# Patient Record
Sex: Female | Born: 1935 | Race: White | Hispanic: No | Smoking: Former smoker
Health system: Southern US, Community
[De-identification: ages and names within clinical notes are randomized; demographics above are authoritative.]

## PROBLEM LIST (undated history)

## (undated) DIAGNOSIS — E785 Hyperlipidemia, unspecified: Secondary | ICD-10-CM

## (undated) DIAGNOSIS — I10 Essential (primary) hypertension: Secondary | ICD-10-CM

## (undated) HISTORY — DX: Hyperlipidemia, unspecified: E78.5

## (undated) HISTORY — PX: JOINT REPLACEMENT: SHX530

## (undated) HISTORY — DX: Essential (primary) hypertension: I10

---

## 1991-10-30 HISTORY — PX: COSMETIC SURGERY: SHX468

## 2000-10-29 HISTORY — PX: KNEE SURGERY: SHX244

## 2002-08-29 HISTORY — PX: BREAST SURGERY: SHX581

## 2004-09-19 ENCOUNTER — Ambulatory Visit: Payer: Self-pay | Admitting: Family Medicine

## 2004-10-09 ENCOUNTER — Ambulatory Visit: Payer: Self-pay | Admitting: Unknown Physician Specialty

## 2005-06-08 ENCOUNTER — Ambulatory Visit: Payer: Self-pay | Admitting: General Surgery

## 2006-07-04 ENCOUNTER — Ambulatory Visit: Payer: Self-pay | Admitting: Family Medicine

## 2007-04-30 ENCOUNTER — Ambulatory Visit: Payer: Self-pay | Admitting: Family Medicine

## 2007-09-04 ENCOUNTER — Ambulatory Visit: Payer: Self-pay | Admitting: Family Medicine

## 2007-09-08 ENCOUNTER — Ambulatory Visit: Payer: Self-pay | Admitting: Family Medicine

## 2008-09-07 ENCOUNTER — Ambulatory Visit: Payer: Self-pay | Admitting: Family Medicine

## 2009-10-05 ENCOUNTER — Ambulatory Visit: Payer: Self-pay | Admitting: Family Medicine

## 2009-11-16 ENCOUNTER — Ambulatory Visit: Payer: Self-pay | Admitting: Ophthalmology

## 2012-04-23 ENCOUNTER — Ambulatory Visit: Payer: Self-pay | Admitting: Ophthalmology

## 2012-10-07 ENCOUNTER — Ambulatory Visit: Payer: Self-pay | Admitting: Family Medicine

## 2012-10-09 ENCOUNTER — Ambulatory Visit: Payer: Self-pay | Admitting: Family Medicine

## 2013-07-13 ENCOUNTER — Ambulatory Visit: Payer: Self-pay | Admitting: Unknown Physician Specialty

## 2014-03-30 LAB — HEPATIC FUNCTION PANEL
ALT: 15 U/L (ref 7–35)
AST: 17 U/L (ref 13–35)

## 2014-03-30 LAB — LIPID PANEL
Cholesterol: 203 mg/dL — AB (ref 0–200)
HDL: 61 mg/dL (ref 35–70)
LDL Cholesterol: 111 mg/dL
Triglycerides: 155 mg/dL (ref 40–160)

## 2014-03-30 LAB — TSH: TSH: 2.44 u[IU]/mL (ref 0.41–5.90)

## 2014-03-30 LAB — CBC AND DIFFERENTIAL
HCT: 42 % (ref 36–46)
Hemoglobin: 14 g/dL (ref 12.0–16.0)
Platelets: 236 10*3/uL (ref 150–399)
WBC: 6.5 10*3/mL

## 2014-03-30 LAB — HEMOGLOBIN A1C: Hgb A1c MFr Bld: 6.5 % — AB (ref 4.0–6.0)

## 2014-03-30 LAB — BASIC METABOLIC PANEL
BUN: 13 mg/dL (ref 4–21)
CREATININE: 0.8 mg/dL (ref 0.5–1.1)
GLUCOSE: 108 mg/dL
POTASSIUM: 4.5 mmol/L (ref 3.4–5.3)
SODIUM: 139 mmol/L (ref 137–147)

## 2014-06-01 DIAGNOSIS — R0681 Apnea, not elsewhere classified: Secondary | ICD-10-CM | POA: Insufficient documentation

## 2014-06-23 IMAGING — US ULTRASOUND RIGHT BREAST
1 series · 14 of 23 positions shown · non-contrast
Comparison: none

REASON FOR EXAM: av rt nodular density
COMMENTS:

[Series 1: ultrasound right breast · 0.11mm/px · 14 of 23 slices shown]
[im 1/23]
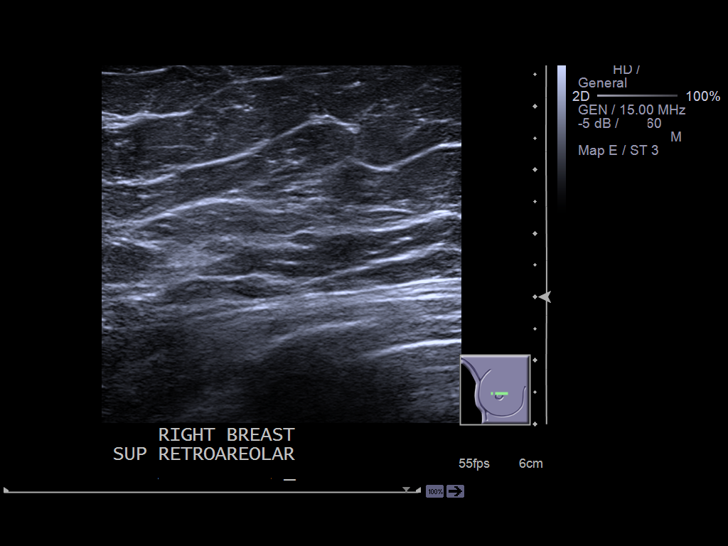
[im 3/23]
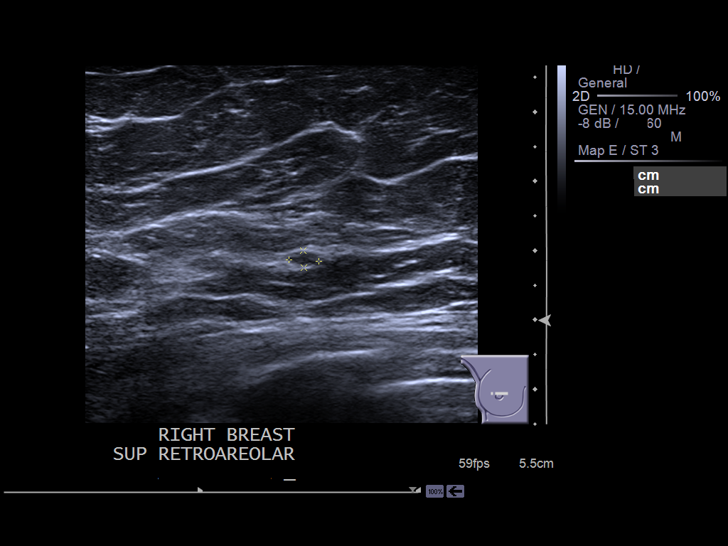
[im 5/23]
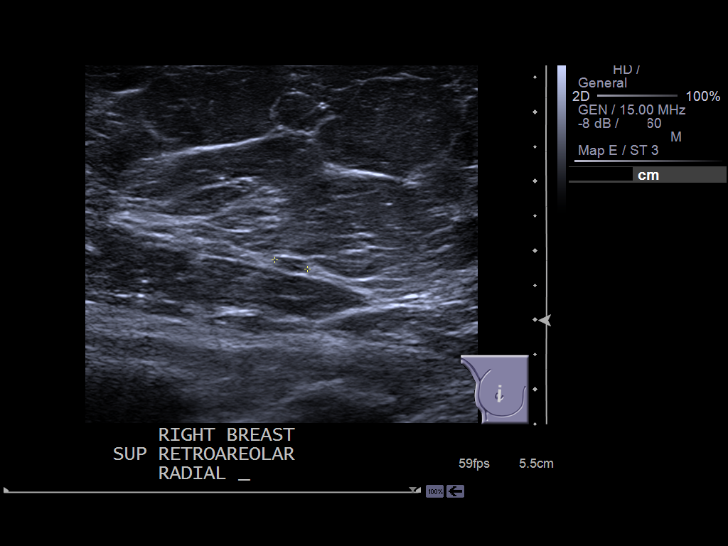
[im 6/23]
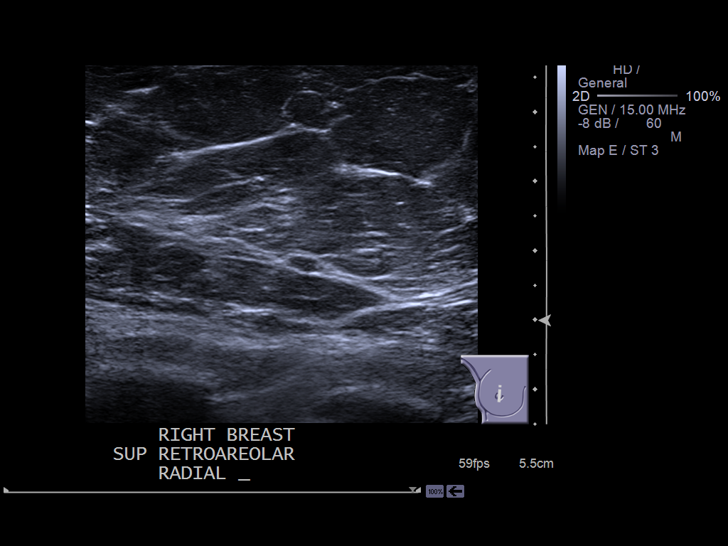
[im 8/23]
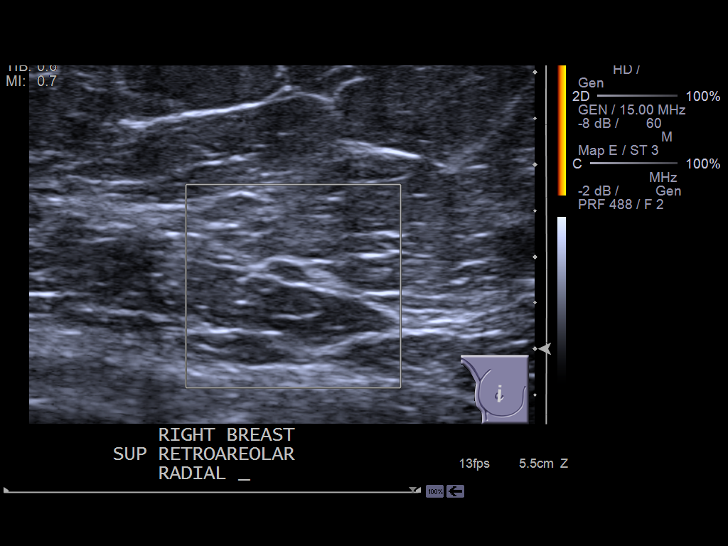
[im 10/23]
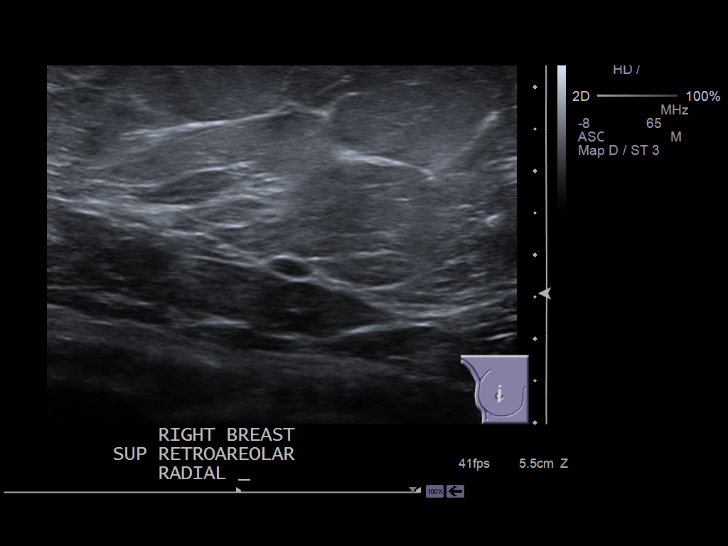
[im 11/23]
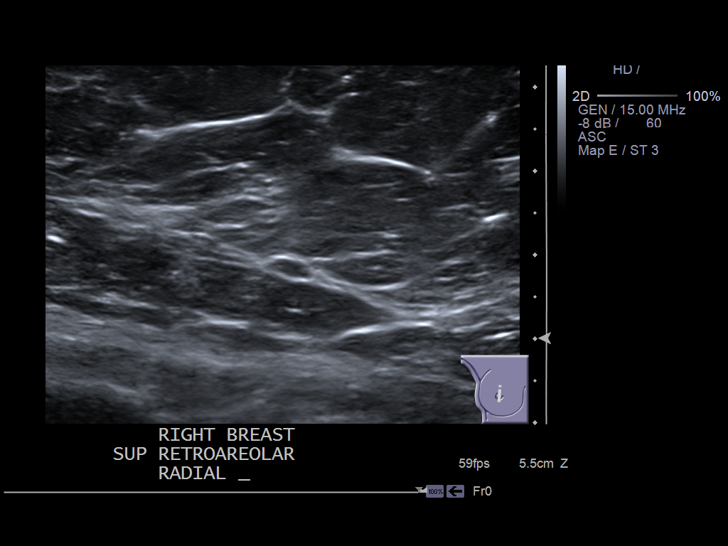
[im 13/23]
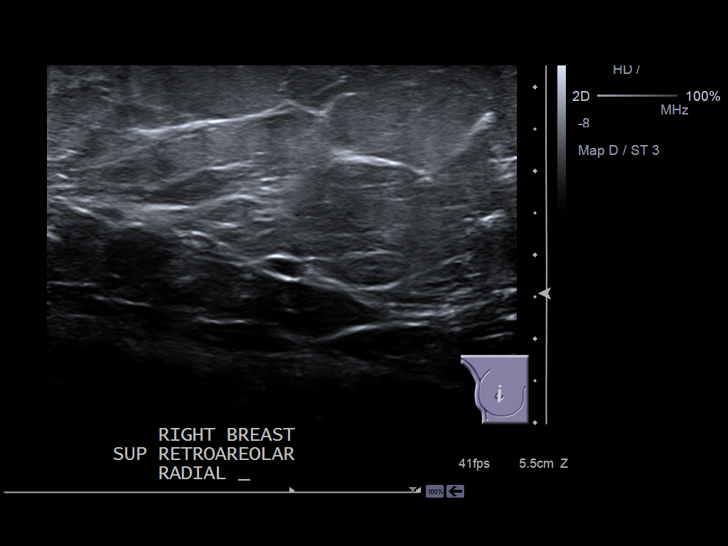
[im 14/23]
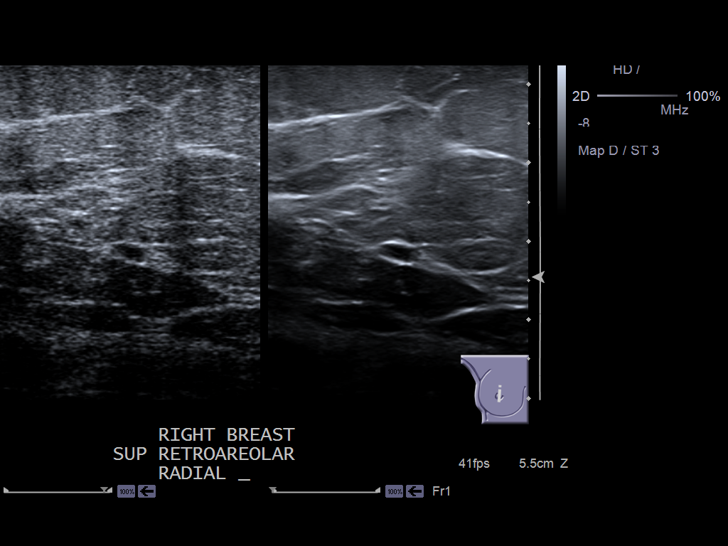
[im 16/23]
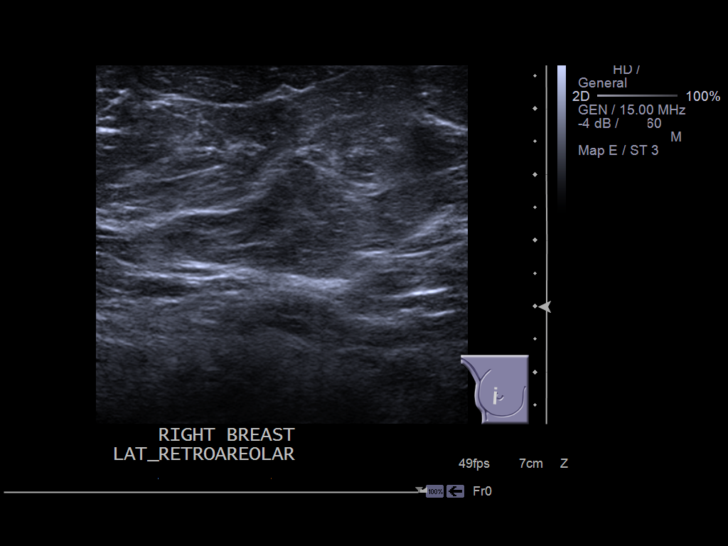
[im 18/23]
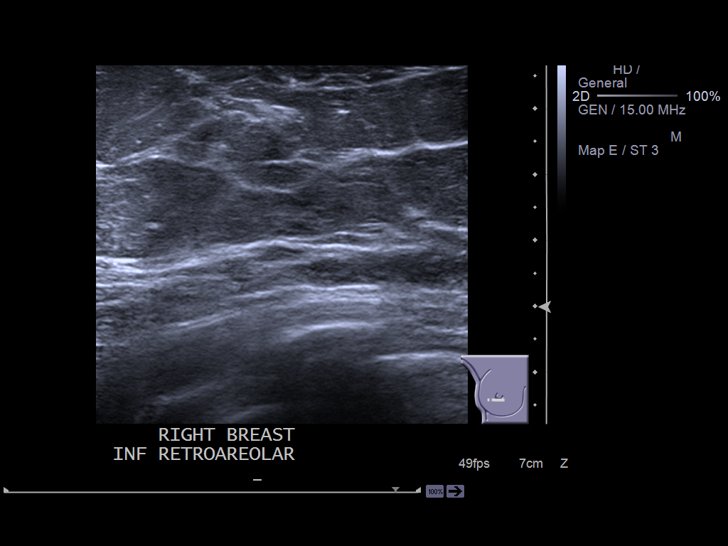
[im 19/23]
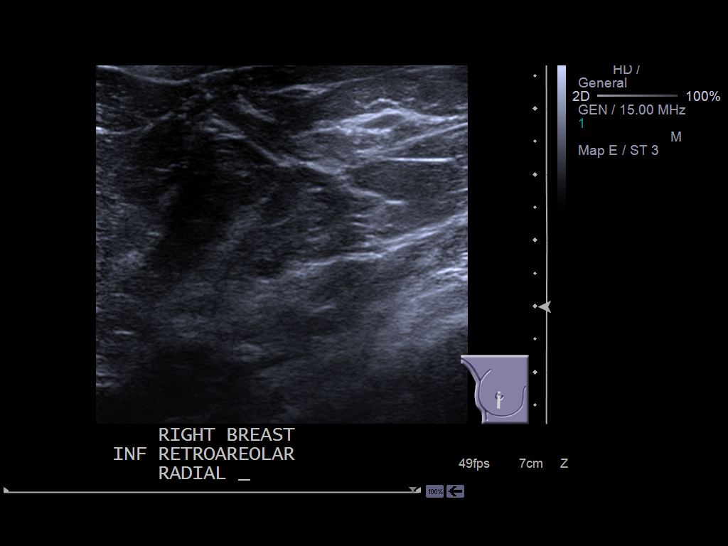
[im 21/23]
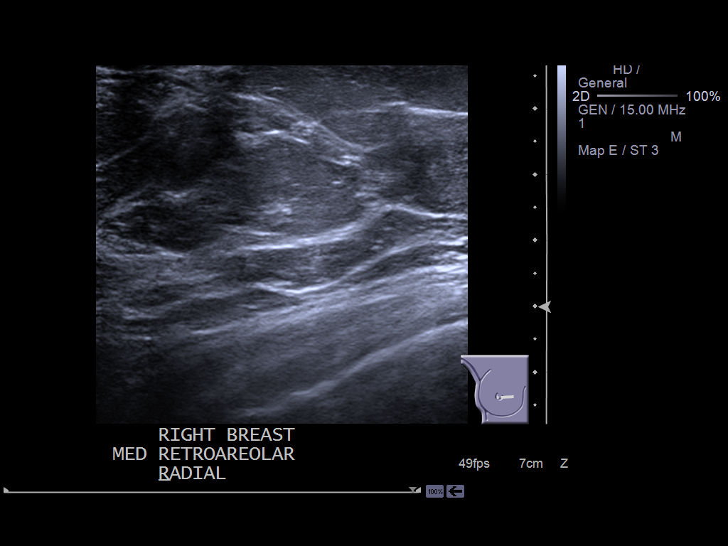
[im 23/23]
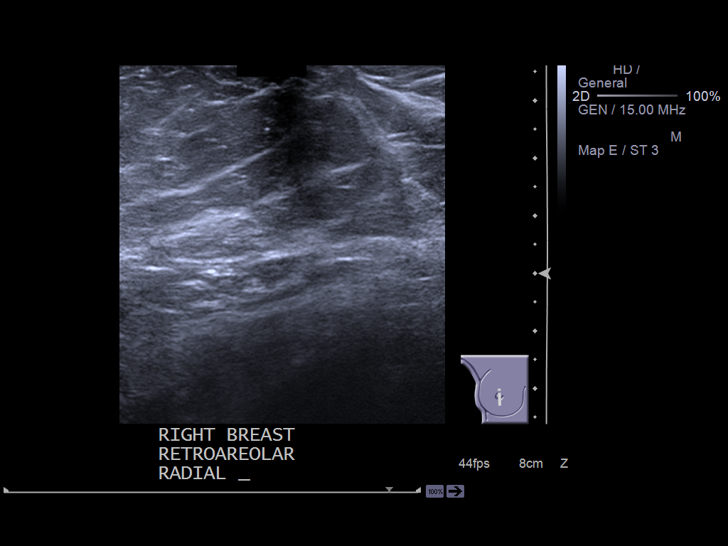

[14 of 23 positions shown; findings below may reference images not displayed]

PROCEDURE:     US  - US BREAST RIGHT  - October 09, 2012  [DATE]

RESULT:     Additional views of the right breast reveal a nodular density in
the central portion right breast adjacent to the chest wall. Ultrasound was
performed reveals a 4 mm hypoechoic nodule. Surgical consultation is
suggested for further evaluation. Tiny malignancy cannot be excluded. If
need be we can perform ultrasound guided needle localization for surgical
removal.
IMPRESSION: BI-RADS: Category 4 - Suspicious Abnormality - Biopsy
Should Be Considered

A NEGATIVE MAMMOGRAM REPORT DOES NOT PRECLUDE BIOPSY OR OTHER EVALUATION OF
A CLINICALLY PALPABLE OR OTHERWISE SUSPICIOUS MASS OR LESION. BREAST CANCER
MAY NOT BE DETECTED IN UP TO 10% OF CASES.

## 2014-07-08 ENCOUNTER — Ambulatory Visit: Payer: Self-pay | Admitting: Surgery

## 2015-05-30 ENCOUNTER — Other Ambulatory Visit: Payer: Self-pay

## 2015-05-30 DIAGNOSIS — E785 Hyperlipidemia, unspecified: Secondary | ICD-10-CM

## 2015-05-30 MED ORDER — ROSUVASTATIN CALCIUM 5 MG PO TABS: 5.0000 mg | ORAL_TABLET | Freq: Every day | ORAL | Status: DC

## 2015-05-30 NOTE — Telephone Encounter (Signed)
Last OV 03/2014

## 2015-06-30 ENCOUNTER — Other Ambulatory Visit: Payer: Self-pay

## 2015-06-30 DIAGNOSIS — E785 Hyperlipidemia, unspecified: Secondary | ICD-10-CM

## 2015-06-30 MED ORDER — ROSUVASTATIN CALCIUM 5 MG PO TABS: 5.0000 mg | ORAL_TABLET | Freq: Every day | ORAL | Status: DC

## 2015-06-30 NOTE — Telephone Encounter (Signed)
Has CPE scheduled for 07/11/2015.  Thanks,   -Mickel Baas

## 2015-07-12 ENCOUNTER — Encounter: Payer: Self-pay | Admitting: Family Medicine

## 2015-07-12 ENCOUNTER — Ambulatory Visit (INDEPENDENT_AMBULATORY_CARE_PROVIDER_SITE_OTHER): Payer: Medicare Other | Admitting: Family Medicine

## 2015-07-12 VITALS — BP 138/72 | HR 60 | Temp 97.6°F | Resp 16 | Ht 64.5 in | Wt 181.0 lb

## 2015-07-12 DIAGNOSIS — I4892 Unspecified atrial flutter: Secondary | ICD-10-CM

## 2015-07-12 DIAGNOSIS — R739 Hyperglycemia, unspecified: Secondary | ICD-10-CM | POA: Insufficient documentation

## 2015-07-12 DIAGNOSIS — M419 Scoliosis, unspecified: Secondary | ICD-10-CM | POA: Insufficient documentation

## 2015-07-12 DIAGNOSIS — M810 Age-related osteoporosis without current pathological fracture: Secondary | ICD-10-CM | POA: Diagnosis not present

## 2015-07-12 DIAGNOSIS — E785 Hyperlipidemia, unspecified: Secondary | ICD-10-CM

## 2015-07-12 DIAGNOSIS — M542 Cervicalgia: Secondary | ICD-10-CM | POA: Insufficient documentation

## 2015-07-12 DIAGNOSIS — K284 Chronic or unspecified gastrojejunal ulcer with hemorrhage: Secondary | ICD-10-CM | POA: Insufficient documentation

## 2015-07-12 DIAGNOSIS — Z87448 Personal history of other diseases of urinary system: Secondary | ICD-10-CM | POA: Diagnosis not present

## 2015-07-12 DIAGNOSIS — K274 Chronic or unspecified peptic ulcer, site unspecified, with hemorrhage: Secondary | ICD-10-CM | POA: Insufficient documentation

## 2015-07-12 DIAGNOSIS — E559 Vitamin D deficiency, unspecified: Secondary | ICD-10-CM | POA: Diagnosis not present

## 2015-07-12 DIAGNOSIS — Z8701 Personal history of pneumonia (recurrent): Secondary | ICD-10-CM | POA: Insufficient documentation

## 2015-07-12 DIAGNOSIS — Z87718 Personal history of other specified (corrected) congenital malformations of genitourinary system: Secondary | ICD-10-CM | POA: Insufficient documentation

## 2015-07-12 DIAGNOSIS — K219 Gastro-esophageal reflux disease without esophagitis: Secondary | ICD-10-CM | POA: Insufficient documentation

## 2015-07-12 DIAGNOSIS — Z0000 Encounter for general adult medical examination without abnormal findings: Secondary | ICD-10-CM

## 2015-07-12 DIAGNOSIS — Z23 Encounter for immunization: Secondary | ICD-10-CM

## 2015-07-12 DIAGNOSIS — D509 Iron deficiency anemia, unspecified: Secondary | ICD-10-CM | POA: Insufficient documentation

## 2015-07-12 DIAGNOSIS — D649 Anemia, unspecified: Secondary | ICD-10-CM | POA: Insufficient documentation

## 2015-07-12 DIAGNOSIS — M1990 Unspecified osteoarthritis, unspecified site: Secondary | ICD-10-CM | POA: Insufficient documentation

## 2015-07-12 DIAGNOSIS — I10 Essential (primary) hypertension: Secondary | ICD-10-CM | POA: Insufficient documentation

## 2015-07-12 NOTE — Progress Notes (Signed)
Patient ID: Natalie Lucas, female   DOB: December 01, 1926, 81 y.o.   MRN: [id number]         Patient: Horald Pollen, Female    DOB: [birth year], 81 y.o.   MRN: [id number] Visit Date: 07/12/2015  Today's Provider: Margarita Rana, MD   Chief Complaint  Patient presents with  . Medicare Wellness   Subjective:    Annual wellness visit MICAIAH REMILLARD is a 81 y.o. female. She feels fairly well. She reports not exercising. She reports she is sleeping well. Screening tests performed today.  Taking medication without any difficulty.   -----------------------------------------------------------   Review of Systems  Social History   Social History  . Marital Status: Widowed    Spouse Name: N/A  . Number of Children: 2  . Years of Education: College   Occupational History  . Retired    Social History Main Topics  . Smoking status: Never Smoker   . Smokeless tobacco: Never Used  . Alcohol Use: No  . Drug Use: No  . Sexual Activity: Not on file   Other Topics Concern  . Not on file   Social History Narrative    Patient Active Problem List   Diagnosis Date Noted  . Absolute anemia 07/12/2015  . Atrial flutter 07/12/2015  . Bleeding ulcer 07/12/2015  . Acid reflux 07/12/2015  . H/O: pneumonia 07/12/2015  . History of urinary anomaly 07/12/2015  . Blood glucose elevated 07/12/2015  . BP (high blood pressure) 07/12/2015  . Scoliosis 07/12/2015  . Arthritis, degenerative 07/12/2015  . OP (osteoporosis) 07/12/2015  . Cervical pain 07/12/2015  . Avitaminosis D 07/12/2015  . Microcytic anemia 07/12/2015  . Hyperlipemia 05/30/2015  . Breathlessness on exertion 06/01/2014    Past Surgical History  Procedure Laterality Date  . Breast surgery Right 08/2002    Ductal Hyperplasia  . Knee surgery Left 2002  . Cosmetic surgery  1993  . Joint replacement Left     Left knee replacement.    Her family history includes Cancer in her paternal grandfather and paternal uncle; Dementia  in her mother; Lung cancer in her father; Melanoma in her cousin; Transient ischemic attack in her mother.    Previous Medications   CALCIUM CARBONATE (OS-CAL) 600 MG TABS TABLET    Take by mouth.   ESOMEPRAZOLE (NEXIUM) 40 MG CAPSULE    Take by mouth.   HYDROCHLOROTHIAZIDE (HYDRODIURIL) 12.5 MG TABLET    Take by mouth.   METOPROLOL (LOPRESSOR) 50 MG TABLET    Take by mouth.   MULTIPLE VITAMIN PO    Take by mouth.   ROSUVASTATIN (CRESTOR) 5 MG TABLET    Take 1 tablet (5 mg total) by mouth daily.    Patient Care Team: Margarita Rana, MD as PCP - General (Family Medicine)     Objective:   Vitals: BP 138/72 mmHg  Pulse 60  Temp(Src) 97.6 F (36.4 C) (Oral)  Resp 16  Ht 5' 4.5" (1.638 m)  Wt 181 lb (82.101 kg)  BMI 30.60 kg/m2  Physical Exam  Constitutional: She is oriented to person, place, and time. She appears well-developed and well-nourished.  HENT:  Head: Normocephalic and atraumatic.  Right Ear: Tympanic membrane, external ear and ear canal normal.  Left Ear: Tympanic membrane, external ear and ear canal normal.  Nose: Nose normal.  Mouth/Throat: Uvula is midline, oropharynx is clear and moist and mucous membranes are normal.  Eyes: Conjunctivae, EOM and lids are normal. Pupils are equal, round, and reactive to  light.  Neck: Trachea normal and normal range of motion. Neck supple. Carotid bruit is not present. No thyroid mass and no thyromegaly present.  Cardiovascular: Normal rate, regular rhythm and normal heart sounds.   Pulmonary/Chest: Effort normal and breath sounds normal.  Abdominal: Soft. Normal appearance and bowel sounds are normal. There is no hepatosplenomegaly. There is no tenderness.  Musculoskeletal: Normal range of motion.  Lymphadenopathy:    She has no cervical adenopathy.    She has no axillary adenopathy.  Neurological: She is alert and oriented to person, place, and time. She has normal strength. No cranial nerve deficit.  Skin: Skin is warm, dry and  intact.  Psychiatric: She has a normal mood and affect. Her speech is normal and behavior is normal. Judgment and thought content normal. Cognition and memory are normal.    Activities of Daily Living In your present state of health, do you have any difficulty performing the following activities: 07/12/2015  Hearing? N  Vision? N  Difficulty concentrating or making decisions? Y  Walking or climbing stairs? Y  Dressing or bathing? N  Doing errands, shopping? N    Fall Risk Assessment Fall Risk  07/12/2015  Falls in the past year? Yes  Number falls in past yr: 1  Injury with Fall? No     Depression Screen PHQ 2/9 Scores 07/12/2015  PHQ - 2 Score 0    Cognitive Testing - 6-CIT  Correct? Score   What year is it? yes 0 0 or 4  What month is it? yes 0 0 or 3  Memorize:    [name],  44,  [address],  [place],      What time is it? (within 1 hour) yes 0 0 or 3  Count backwards from 20 no 2 0, 2, or 4  Name the months of the year yes 0 0, 2, or 4  Repeat name & address above yes 0 0, 2, 4, 6, 8, or 10       TOTAL SCORE  2/28   Interpretation:  Normal  Normal (0-7) Abnormal (8-28)       Assessment & Plan:     Annual Wellness Visit  Reviewed patient's Family Medical History Reviewed and updated list of patient's medical providers Assessment of cognitive impairment was done Assessed patient's functional ability Established a written schedule for health screening [place] Completed and Reviewed  Exercise Activities and Dietary recommendations Goals    None      Immunization History  Administered Date(s) Administered  . Pneumococcal Polysaccharide-23 08/30/1998    Health Maintenance  Topic Date Due  . TETANUS/TDAP  12/03/1947  . COLONOSCOPY  12/02/1978  . ZOSTAVAX  12/02/1988  . DEXA SCAN  12/02/1993  . PNA vac Low Risk Adult (1 of 2 - PCV13) 12/02/1993  . INFLUENZA VACCINE  05/30/2015      Discussed health benefits of physical  activity, and encouraged her to engage in regular exercise appropriate for her age and condition.   1. Medicare annual wellness visit, subsequent Healthy female. Vaccines given today.  Check labs and schedule bone density.  - Pneumococcal conjugate vaccine 13-valent  2. Atrial flutter, unspecified - TSH  3. Need for influenza vaccination - Flu vaccine HIGH DOSE PF (Fluzone High dose)  4. OP (osteoporosis) - DG Bone Density  5. Hyperlipemia - Lipid panel  6. Blood glucose elevated - Hemoglobin A1c - Comprehensive metabolic panel  7. History of urinary anomaly   8. Avitaminosis  D - CBC with Differential/Platelet - Vit D  25 hydroxy (rtn osteoporosis monitoring)    ------------------------------------------------------------------------------------------------------------  Patient was seen and examined by Jerrell Belfast, MD, and note scribed by Ashley Royalty, CMA.   I have reviewed the document for accuracy and completeness and I agree with above. Jerrell Belfast, MD   Margarita Rana, MD

## 2015-07-16 LAB — COMPREHENSIVE METABOLIC PANEL
A/G RATIO: 1.4 (ref 1.1–2.5)
ALT: 14 IU/L (ref 0–32)
AST: 18 IU/L (ref 0–40)
Albumin: 3.9 g/dL (ref 3.5–4.7)
Alkaline Phosphatase: 79 IU/L (ref 39–117)
BILIRUBIN TOTAL: 0.3 mg/dL (ref 0.0–1.2)
BUN/Creatinine Ratio: 25 (ref 11–26)
BUN: 18 mg/dL (ref 8–27)
CALCIUM: 8.9 mg/dL (ref 8.7–10.3)
CHLORIDE: 99 mmol/L (ref 97–108)
CO2: 25 mmol/L (ref 18–29)
Creatinine, Ser: 0.72 mg/dL (ref 0.57–1.00)
GFR calc Af Amer: 88 mL/min/{1.73_m2} (ref >59)
GFR, EST NON AFRICAN AMERICAN: 76 mL/min/{1.73_m2} (ref >59)
GLOBULIN, TOTAL: 2.8 g/dL (ref 1.5–4.5)
Glucose: 120 mg/dL — ABNORMAL HIGH (ref 65–99)
POTASSIUM: 4.3 mmol/L (ref 3.5–5.2)
SODIUM: 139 mmol/L (ref 134–144)
Total Protein: 6.7 g/dL (ref 6.0–8.5)

## 2015-07-16 LAB — CBC WITH DIFFERENTIAL/PLATELET
BASOS: 1 %
Basophils Absolute: 0 10*3/uL (ref 0.0–0.2)
EOS (ABSOLUTE): 0.2 10*3/uL (ref 0.0–0.4)
EOS: 4 %
HEMOGLOBIN: 13.1 g/dL (ref 11.1–15.9)
Hematocrit: 39.1 % (ref 34.0–46.6)
Immature Grans (Abs): 0 10*3/uL (ref 0.0–0.1)
Immature Granulocytes: 0 %
LYMPHS ABS: 1.8 10*3/uL (ref 0.7–3.1)
Lymphs: 43 %
MCH: 29.8 pg (ref 26.6–33.0)
MCHC: 33.5 g/dL (ref 31.5–35.7)
MCV: 89 fL (ref 79–97)
MONOCYTES: 9 %
MONOS ABS: 0.4 10*3/uL (ref 0.1–0.9)
Neutrophils Absolute: 1.9 10*3/uL (ref 1.4–7.0)
Neutrophils: 43 %
Platelets: 222 10*3/uL (ref 150–379)
RBC: 4.4 x10E6/uL (ref 3.77–5.28)
RDW: 13.4 % (ref 12.3–15.4)
WBC: 4.2 10*3/uL (ref 3.4–10.8)

## 2015-07-16 LAB — TSH: TSH: 2.94 u[IU]/mL (ref 0.450–4.500)

## 2015-07-16 LAB — LIPID PANEL
CHOL/HDL RATIO: 3.7 ratio (ref 0.0–4.4)
Cholesterol, Total: 238 mg/dL — ABNORMAL HIGH (ref 100–199)
HDL: 64 mg/dL (ref >39)
LDL CALC: 150 mg/dL — AB (ref 0–99)
Triglycerides: 121 mg/dL (ref 0–149)
VLDL CHOLESTEROL CAL: 24 mg/dL (ref 5–40)

## 2015-07-16 LAB — HEMOGLOBIN A1C
ESTIMATED AVERAGE GLUCOSE: 128 mg/dL
Hgb A1c MFr Bld: 6.1 % — ABNORMAL HIGH (ref 4.8–5.6)

## 2015-07-16 LAB — VITAMIN D 25 HYDROXY (VIT D DEFICIENCY, FRACTURES): VIT D 25 HYDROXY: 41.7 ng/mL (ref 30.0–100.0)

## 2015-07-21 ENCOUNTER — Ambulatory Visit: Payer: Self-pay

## 2015-07-26 ENCOUNTER — Ambulatory Visit
Admission: RE | Admit: 2015-07-26 | Discharge: 2015-07-26 | Disposition: A | Discharge status: Home / Self Care | Payer: Medicare Other | Source: Ambulatory Visit | Attending: Family Medicine | Admitting: Family Medicine

## 2015-07-26 DIAGNOSIS — M810 Age-related osteoporosis without current pathological fracture: Secondary | ICD-10-CM | POA: Insufficient documentation

## 2015-07-29 ENCOUNTER — Telehealth: Payer: Self-pay

## 2015-07-29 NOTE — Telephone Encounter (Signed)
-----   Message from Margarita Rana, MD sent at 07/29/2015  1:15 PM EDT ----- Still with bone thinning, osteoporosis, not changed much from 2010. Please see if patient would like to start a medication to treat as is at higher risk of fracture. OV if would like to discuss treatment options. Thanks.

## 2015-07-29 NOTE — Telephone Encounter (Signed)
Pt would like to consider her options for this, and will call back. Renaldo Fiddler, CMA

## 2015-12-15 DIAGNOSIS — E7800 Pure hypercholesterolemia, unspecified: Secondary | ICD-10-CM | POA: Diagnosis not present

## 2015-12-15 DIAGNOSIS — I10 Essential (primary) hypertension: Secondary | ICD-10-CM | POA: Diagnosis not present

## 2015-12-15 DIAGNOSIS — R0602 Shortness of breath: Secondary | ICD-10-CM | POA: Diagnosis not present

## 2015-12-15 DIAGNOSIS — I483 Typical atrial flutter: Secondary | ICD-10-CM | POA: Diagnosis not present

## 2016-01-24 ENCOUNTER — Ambulatory Visit: Payer: Medicare Other | Admitting: Dietician

## 2016-02-07 ENCOUNTER — Other Ambulatory Visit: Payer: Self-pay | Admitting: Family Medicine

## 2016-02-07 DIAGNOSIS — I10 Essential (primary) hypertension: Secondary | ICD-10-CM

## 2016-03-27 DIAGNOSIS — L820 Inflamed seborrheic keratosis: Secondary | ICD-10-CM | POA: Diagnosis not present

## 2016-03-27 DIAGNOSIS — L304 Erythema intertrigo: Secondary | ICD-10-CM | POA: Diagnosis not present

## 2016-03-27 DIAGNOSIS — L570 Actinic keratosis: Secondary | ICD-10-CM | POA: Diagnosis not present

## 2016-03-27 DIAGNOSIS — L821 Other seborrheic keratosis: Secondary | ICD-10-CM | POA: Diagnosis not present

## 2016-03-27 DIAGNOSIS — Z85828 Personal history of other malignant neoplasm of skin: Secondary | ICD-10-CM | POA: Diagnosis not present

## 2016-04-18 DIAGNOSIS — I10 Essential (primary) hypertension: Secondary | ICD-10-CM | POA: Diagnosis not present

## 2016-04-18 DIAGNOSIS — I483 Typical atrial flutter: Secondary | ICD-10-CM | POA: Diagnosis not present

## 2016-04-18 DIAGNOSIS — E7800 Pure hypercholesterolemia, unspecified: Secondary | ICD-10-CM | POA: Diagnosis not present

## 2016-04-18 DIAGNOSIS — R0602 Shortness of breath: Secondary | ICD-10-CM | POA: Diagnosis not present

## 2016-04-18 DIAGNOSIS — K274 Chronic or unspecified peptic ulcer, site unspecified, with hemorrhage: Secondary | ICD-10-CM | POA: Diagnosis not present

## 2016-05-15 ENCOUNTER — Other Ambulatory Visit: Payer: Self-pay | Admitting: Family Medicine

## 2016-05-15 DIAGNOSIS — I10 Essential (primary) hypertension: Secondary | ICD-10-CM

## 2016-06-25 DIAGNOSIS — Z0000 Encounter for general adult medical examination without abnormal findings: Secondary | ICD-10-CM | POA: Diagnosis not present

## 2016-06-25 DIAGNOSIS — I10 Essential (primary) hypertension: Secondary | ICD-10-CM | POA: Diagnosis not present

## 2016-06-25 DIAGNOSIS — I483 Typical atrial flutter: Secondary | ICD-10-CM | POA: Diagnosis not present

## 2016-06-25 DIAGNOSIS — E7800 Pure hypercholesterolemia, unspecified: Secondary | ICD-10-CM | POA: Diagnosis not present

## 2016-07-07 ENCOUNTER — Other Ambulatory Visit: Payer: Self-pay | Admitting: Family Medicine

## 2016-07-07 DIAGNOSIS — E785 Hyperlipidemia, unspecified: Secondary | ICD-10-CM

## 2016-07-18 ENCOUNTER — Ambulatory Visit (INDEPENDENT_AMBULATORY_CARE_PROVIDER_SITE_OTHER): Payer: Medicare HMO | Admitting: Physician Assistant

## 2016-07-18 ENCOUNTER — Encounter: Payer: Self-pay | Admitting: Physician Assistant

## 2016-07-18 VITALS — BP 120/64 | HR 78 | Temp 97.6°F | Resp 16 | Ht 65.0 in | Wt 179.0 lb

## 2016-07-18 DIAGNOSIS — D649 Anemia, unspecified: Secondary | ICD-10-CM | POA: Diagnosis not present

## 2016-07-18 DIAGNOSIS — R739 Hyperglycemia, unspecified: Secondary | ICD-10-CM | POA: Diagnosis not present

## 2016-07-18 DIAGNOSIS — Z0000 Encounter for general adult medical examination without abnormal findings: Secondary | ICD-10-CM

## 2016-07-18 DIAGNOSIS — Z23 Encounter for immunization: Secondary | ICD-10-CM | POA: Diagnosis not present

## 2016-07-18 DIAGNOSIS — E785 Hyperlipidemia, unspecified: Secondary | ICD-10-CM | POA: Diagnosis not present

## 2016-07-18 DIAGNOSIS — I10 Essential (primary) hypertension: Secondary | ICD-10-CM

## 2016-07-18 NOTE — Patient Instructions (Signed)

## 2016-07-18 NOTE — Progress Notes (Signed)
Patient: Natalie Lucas, Female    DOB: 03/30/1927, 82 y.o.   MRN: [id number] Visit Date: 07/18/2016  Today's Provider: Mar Daring, PA-C   Chief Complaint  Patient presents with  . Medicare Wellness   Subjective:    Annual wellness visit Natalie Lucas is a 82 y.o. female. She feels well. She reports exercising none. She reports she is sleeping well. 07/12/15 AWE 07/09/15 Mammogram-BI-RADS 1 07/26/15 BMD-osteoporosis 06/08/05 Colonoscopy  -----------------------------------------------------------   Review of Systems  Constitutional: Positive for fatigue.  HENT: Negative.   Eyes: Negative.   Respiratory: Positive for shortness of breath.   Cardiovascular: Positive for leg swelling.  Gastrointestinal: Negative.   Endocrine: Negative.   Genitourinary: Positive for frequency and urgency.  Musculoskeletal: Negative.   Skin: Negative.   Allergic/Immunologic: Negative.   Neurological: Negative.   Hematological: Negative.   Psychiatric/Behavioral: Negative.   All ROS issues are chronic and has had for years.  Social History   Social History  . Marital status: Widowed    Spouse name: N/A  . Number of children: 2  . Years of education: College   Occupational History  . Retired    Social History Main Topics  . Smoking status: Never Smoker  . Smokeless tobacco: Never Used  . Alcohol use No  . Drug use: No  . Sexual activity: Not on file   Other Topics Concern  . Not on file   Social History Narrative  . No narrative on file    History reviewed. No pertinent past medical history.   Patient Active Problem List   Diagnosis Date Noted  . Absolute anemia 07/12/2015  . Atrial flutter ([place]) 07/12/2015  . Bleeding ulcer 07/12/2015  . Acid reflux 07/12/2015  . H/O: pneumonia 07/12/2015  . History of urinary anomaly 07/12/2015  . Blood glucose elevated 07/12/2015  . BP (high blood pressure) 07/12/2015  . Scoliosis 07/12/2015  . Arthritis,  degenerative 07/12/2015  . OP (osteoporosis) 07/12/2015  . Cervical pain 07/12/2015  . Avitaminosis D 07/12/2015  . Microcytic anemia 07/12/2015  . Hyperlipemia 05/30/2015  . Breathlessness on exertion 06/01/2014    Past Surgical History:  Procedure Laterality Date  . BREAST SURGERY Right 08/2002   Ductal Hyperplasia  . COSMETIC SURGERY  1993  . JOINT REPLACEMENT Left    Left knee replacement.  [facility] Kitchen KNEE SURGERY Left 2002    Her family history includes Cancer in her paternal grandfather and paternal uncle; Dementia in her mother; Lung cancer in her father; Melanoma in her cousin; Transient ischemic attack in her mother.    Current Meds  Medication Sig  . calcium carbonate (OS-CAL) 600 MG TABS tablet Take 600 mg by mouth daily with breakfast.   . esomeprazole (NEXIUM) 40 MG capsule Take by mouth as needed.   . hydrochlorothiazide (MICROZIDE) 12.5 MG capsule TAKE ONE CAPSULE BY MOUTH DAILY  . metoprolol (LOPRESSOR) 50 MG tablet Take 50 mg by mouth 2 (two) times daily.   . MULTIPLE VITAMIN PO Take by mouth.  . rosuvastatin (CRESTOR) 5 MG tablet TAKE ONE (1) TABLET BY MOUTH EVERY DAY    Patient Care Team: Mar Daring, PA-C as PCP - General (Family Medicine)    Objective:   Vitals: BP 120/64 (BP Location: Left Arm, Patient Position: Sitting, Cuff Size: Large)   Pulse 78   Temp 97.6 F (36.4 C) (Oral)   Resp 16   Ht 5\' 5"  (1.651 m)   Wt 179 lb (81.2  kg)   SpO2 97%   BMI 29.79 kg/m   Physical Exam  Constitutional: She is oriented to person, place, and time. She appears well-developed and well-nourished.  HENT:  Head: Normocephalic and atraumatic.  Right Ear: Tympanic membrane, external ear and ear canal normal.  Left Ear: Tympanic membrane, external ear and ear canal normal.  Nose: Nose normal.  Mouth/Throat: Uvula is midline, oropharynx is clear and moist and mucous membranes are normal. She has dentures.  Eyes: Conjunctivae and EOM are normal. Pupils are equal,  round, and reactive to light.  Neck: Normal range of motion. Neck supple. Carotid bruit is not present.  Cardiovascular: Normal rate, regular rhythm and normal heart sounds.   Pulmonary/Chest: Effort normal and breath sounds normal.  Abdominal: Soft. Bowel sounds are normal.  Musculoskeletal: Normal range of motion. She exhibits edema (1+ pitting edema with reticular veins).  Neurological: She is alert and oriented to person, place, and time.  Skin: Skin is warm and dry.  Psychiatric: She has a normal mood and affect. Her behavior is normal. Judgment and thought content normal.    Activities of Daily Living In your present state of health, do you have any difficulty performing the following activities: 07/18/2016  Hearing? Y  Vision? N  Difficulty concentrating or making decisions? N  Walking or climbing stairs? Y  Dressing or bathing? N  Doing errands, shopping? N  Some recent data might be hidden    Fall Risk Assessment Fall Risk  07/18/2016 07/12/2015  Falls in the past year? No Yes  Number falls in past yr: - 1  Injury with Fall? - No     Depression Screen PHQ 2/9 Scores 07/18/2016 07/12/2015  PHQ - 2 Score 0 0    Cognitive Testing - 6-CIT  Correct? Score   What year is it? yes 0 0 or 4  What month is it? yes 0 0 or 3  Memorize:    Natalie Lucas,  44,  [address]  [place],      What time is it? (within 1 hour) yes 0 0 or 3  Count backwards from 20 yes 0 0, 2, or 4  Name the months of the year yes 1 0, 2, or 4  Repeat name & address above yes 2 0, 2, 4, 6, 8, or 10       TOTAL SCORE  3/28   Interpretation:  Normal  Normal (0-7) Abnormal (8-28)       Assessment & Plan:     Annual Wellness Visit  Reviewed patient's Family Medical History Reviewed and updated list of patient's medical providers Assessment of cognitive impairment was done Assessed patient's functional ability Established a written schedule for health screening [place]  Completed and Reviewed  Exercise Activities and Dietary recommendations Goals    None      Immunization History  Administered Date(s) Administered  . Influenza, High Dose Seasonal PF 07/12/2015  . Pneumococcal Conjugate-13 07/12/2015  . Pneumococcal Polysaccharide-23 08/30/1998    Health Maintenance  Topic Date Due  . TETANUS/TDAP  12/03/1947  . ZOSTAVAX  12/02/1988  . INFLUENZA VACCINE  05/29/2016  . DEXA SCAN  Completed  . PNA vac Low Risk Adult  Completed      Discussed health benefits of physical activity, and encouraged her to engage in regular exercise appropriate for her age and condition.   1. Medicare annual wellness visit, subsequent Normal physical exam.  2. Essential hypertension Stable. Continue current medical treatment plan  of metoprolol and HCTZ. Will check labs as below and f/u pending results. - CBC w/Diff/Platelet - Comprehensive Metabolic Panel (CMET)  3. Hyperlipemia Stable. Continue rosuvastatin. Will check labs as below and f/u pending results. - Lipid Profile  4. Anemia, unspecified anemia type Will check labs as below and f/u pending results. - CBC w/Diff/Platelet  5. Blood glucose elevated Will check labs as below and f/u pending results. - Comprehensive Metabolic Panel (CMET) - HgB A1c  6. Need for influenza vaccination Flu vaccine given today without complication. Patient sat upright for 15 minutes to check for adverse reaction before being released. - Flu vaccine HIGH DOSE PF (Fluzone High dose)    ------------------------------------------------------------------------------------------------------------    Mar Daring, PA-C  [facility]

## 2016-08-20 DIAGNOSIS — D649 Anemia, unspecified: Secondary | ICD-10-CM | POA: Diagnosis not present

## 2016-08-20 DIAGNOSIS — E785 Hyperlipidemia, unspecified: Secondary | ICD-10-CM | POA: Diagnosis not present

## 2016-08-20 DIAGNOSIS — R739 Hyperglycemia, unspecified: Secondary | ICD-10-CM | POA: Diagnosis not present

## 2016-08-20 DIAGNOSIS — I10 Essential (primary) hypertension: Secondary | ICD-10-CM | POA: Diagnosis not present

## 2016-08-21 ENCOUNTER — Telehealth: Payer: Self-pay

## 2016-08-21 LAB — LIPID PANEL
CHOL/HDL RATIO: 2.8 ratio (ref 0.0–4.4)
Cholesterol, Total: 191 mg/dL (ref 100–199)
HDL: 68 mg/dL (ref >39)
LDL CALC: 97 mg/dL (ref 0–99)
TRIGLYCERIDES: 131 mg/dL (ref 0–149)
VLDL Cholesterol Cal: 26 mg/dL (ref 5–40)

## 2016-08-21 LAB — CBC WITH DIFFERENTIAL/PLATELET
Basophils Absolute: 0 10*3/uL (ref 0.0–0.2)
Basos: 0 %
EOS (ABSOLUTE): 0.2 10*3/uL (ref 0.0–0.4)
EOS: 2 %
HEMATOCRIT: 40.6 % (ref 34.0–46.6)
Hemoglobin: 13.8 g/dL (ref 11.1–15.9)
IMMATURE GRANULOCYTES: 0 %
Immature Grans (Abs): 0 10*3/uL (ref 0.0–0.1)
LYMPHS: 28 %
Lymphocytes Absolute: 2 10*3/uL (ref 0.7–3.1)
MCH: 29.6 pg (ref 26.6–33.0)
MCHC: 34 g/dL (ref 31.5–35.7)
MCV: 87 fL (ref 79–97)
MONOCYTES: 7 %
MONOS ABS: 0.5 10*3/uL (ref 0.1–0.9)
NEUTROS PCT: 63 %
Neutrophils Absolute: 4.7 10*3/uL (ref 1.4–7.0)
Platelets: 216 10*3/uL (ref 150–379)
RBC: 4.67 x10E6/uL (ref 3.77–5.28)
RDW: 14.2 % (ref 12.3–15.4)
WBC: 7.4 10*3/uL (ref 3.4–10.8)

## 2016-08-21 LAB — COMPREHENSIVE METABOLIC PANEL
ALT: 17 IU/L (ref 0–32)
AST: 19 IU/L (ref 0–40)
Albumin/Globulin Ratio: 1.3 (ref 1.2–2.2)
Albumin: 4 g/dL (ref 3.5–4.7)
Alkaline Phosphatase: 90 IU/L (ref 39–117)
BUN/Creatinine Ratio: 15 (ref 12–28)
BUN: 12 mg/dL (ref 8–27)
Bilirubin Total: 0.4 mg/dL (ref 0.0–1.2)
CALCIUM: 8.8 mg/dL (ref 8.7–10.3)
CO2: 23 mmol/L (ref 18–29)
CREATININE: 0.79 mg/dL (ref 0.57–1.00)
Chloride: 100 mmol/L (ref 96–106)
GFR calc Af Amer: 78 mL/min/{1.73_m2} (ref >59)
GFR, EST NON AFRICAN AMERICAN: 68 mL/min/{1.73_m2} (ref >59)
GLOBULIN, TOTAL: 3.2 g/dL (ref 1.5–4.5)
Glucose: 111 mg/dL — ABNORMAL HIGH (ref 65–99)
Potassium: 4.3 mmol/L (ref 3.5–5.2)
SODIUM: 141 mmol/L (ref 134–144)
Total Protein: 7.2 g/dL (ref 6.0–8.5)

## 2016-08-21 LAB — HEMOGLOBIN A1C
Est. average glucose Bld gHb Est-mCnc: 128 mg/dL
Hgb A1c MFr Bld: 6.1 % — ABNORMAL HIGH (ref 4.8–5.6)

## 2016-08-21 NOTE — Telephone Encounter (Signed)
Pt advised.   Thanks,   -Mischell Branford  

## 2016-08-21 NOTE — Telephone Encounter (Signed)
-----   Message from Mar Daring, PA-C sent at 08/21/2016  8:28 AM EDT ----- All labs are within normal limits and stable.  Cholesterol now normal. HgBA1c stable at 6.1. Thanks! -JB

## 2016-09-04 DIAGNOSIS — L820 Inflamed seborrheic keratosis: Secondary | ICD-10-CM | POA: Diagnosis not present

## 2016-09-04 DIAGNOSIS — L821 Other seborrheic keratosis: Secondary | ICD-10-CM | POA: Diagnosis not present

## 2016-09-04 DIAGNOSIS — D1800 Hemangioma unspecified site: Secondary | ICD-10-CM | POA: Diagnosis not present

## 2016-09-04 DIAGNOSIS — L853 Xerosis cutis: Secondary | ICD-10-CM | POA: Diagnosis not present

## 2016-09-04 DIAGNOSIS — B079 Viral wart, unspecified: Secondary | ICD-10-CM | POA: Diagnosis not present

## 2016-09-04 DIAGNOSIS — Z85828 Personal history of other malignant neoplasm of skin: Secondary | ICD-10-CM | POA: Diagnosis not present

## 2016-09-04 DIAGNOSIS — L570 Actinic keratosis: Secondary | ICD-10-CM | POA: Diagnosis not present

## 2016-09-04 DIAGNOSIS — D485 Neoplasm of uncertain behavior of skin: Secondary | ICD-10-CM | POA: Diagnosis not present

## 2016-09-04 DIAGNOSIS — Z1283 Encounter for screening for malignant neoplasm of skin: Secondary | ICD-10-CM | POA: Diagnosis not present

## 2016-11-05 DIAGNOSIS — R0602 Shortness of breath: Secondary | ICD-10-CM | POA: Diagnosis not present

## 2016-11-05 DIAGNOSIS — I483 Typical atrial flutter: Secondary | ICD-10-CM | POA: Diagnosis not present

## 2016-11-05 DIAGNOSIS — E7800 Pure hypercholesterolemia, unspecified: Secondary | ICD-10-CM | POA: Diagnosis not present

## 2016-11-05 DIAGNOSIS — I10 Essential (primary) hypertension: Secondary | ICD-10-CM | POA: Diagnosis not present

## 2016-11-19 ENCOUNTER — Ambulatory Visit (INDEPENDENT_AMBULATORY_CARE_PROVIDER_SITE_OTHER): Payer: Medicare HMO | Admitting: Physician Assistant

## 2016-11-19 ENCOUNTER — Telehealth: Payer: Self-pay

## 2016-11-19 ENCOUNTER — Ambulatory Visit
Admission: RE | Admit: 2016-11-19 | Discharge: 2016-11-19 | Disposition: A | Discharge status: Home / Self Care | Payer: Medicare HMO | Source: Ambulatory Visit | Attending: Physician Assistant | Admitting: Physician Assistant

## 2016-11-19 ENCOUNTER — Encounter: Payer: Self-pay | Admitting: Physician Assistant

## 2016-11-19 VITALS — BP 148/92 | HR 68 | Temp 98.1°F | Resp 16 | Wt 174.0 lb

## 2016-11-19 DIAGNOSIS — Z87440 Personal history of urinary (tract) infections: Secondary | ICD-10-CM

## 2016-11-19 DIAGNOSIS — J069 Acute upper respiratory infection, unspecified: Secondary | ICD-10-CM

## 2016-11-19 DIAGNOSIS — R8290 Unspecified abnormal findings in urine: Secondary | ICD-10-CM | POA: Diagnosis not present

## 2016-11-19 DIAGNOSIS — R0602 Shortness of breath: Secondary | ICD-10-CM | POA: Diagnosis not present

## 2016-11-19 DIAGNOSIS — N3090 Cystitis, unspecified without hematuria: Secondary | ICD-10-CM

## 2016-11-19 DIAGNOSIS — R05 Cough: Secondary | ICD-10-CM | POA: Diagnosis not present

## 2016-11-19 LAB — POCT URINALYSIS DIPSTICK
Bilirubin, UA: NEGATIVE
Glucose, UA: NEGATIVE
Ketones, UA: NEGATIVE
Nitrite, UA: NEGATIVE
Spec Grav, UA: 1.02
Urobilinogen, UA: 0.2
pH, UA: 8.5

## 2016-11-19 MED ORDER — BENZONATATE 100 MG PO CAPS: 100.0000 mg | ORAL_CAPSULE | Freq: Three times a day (TID) | ORAL | 0 refills | Status: AC | PRN

## 2016-11-19 MED ORDER — DOXYCYCLINE HYCLATE 100 MG PO TABS: 100.0000 mg | ORAL_TABLET | Freq: Two times a day (BID) | ORAL | 0 refills | Status: AC

## 2016-11-19 NOTE — Telephone Encounter (Signed)
Advised pt of lab results. Pt verbally acknowledges understanding. Kayelee Herbig Drozdowski, CMA   

## 2016-11-19 NOTE — Progress Notes (Signed)
[place]  Chief Complaint  Patient presents with  . URI    Started over a week ago.     Subjective:    Patient ID: Natalie Lucas, female    DOB: [birth year], 83 y.o.   MRN: [id number]  Upper Respiratory Infection: Natalie Lucas is a 83 y.o. female with a past medical history significant for HTN, atrial flutter, and baseline dyspnea on exertion complaining of symptoms of a URI. Symptoms include congestion, cough and sore throat. Onset of symptoms was 1 week ago, unchanged since that time. She also c/o congestion, cough described as productive, nasal congestion, no  fever, post nasal drip and shortness of breath for the past 1 week .  She is drinking plenty of fluids. Sore throat has resolved, has productive sounding cough but nothing comes up. Evaluation to date: none. Treatment to date: cough suppressants. The treatment has provided no relief.   Pt also requested to have her Urine tested.  She reports having recurrent UTI's  She noticed a "Strong Odor" and some "Cloudiness" when urinating.  She denies other UTI Symptoms such as burning, pressure, frequency, or Hematuria.  Denies N/V, flank pain., fever.   Review of Systems  Constitutional: Positive for fatigue. Negative for activity change, appetite change, chills, diaphoresis, fever and unexpected weight change.  HENT: Positive for congestion, postnasal drip, rhinorrhea, sinus pain, sinus pressure and sneezing. Negative for ear discharge, ear pain, hearing loss, nosebleeds, sore throat and trouble swallowing.   Eyes: Negative.   Respiratory: Positive for cough, chest tightness, shortness of breath and wheezing. Negative for apnea, choking and stridor.   Gastrointestinal: Negative.   Genitourinary: Negative for decreased urine volume, dysuria, frequency, hematuria, urgency, vaginal bleeding, vaginal discharge and vaginal pain.  Neurological: Negative for dizziness, light-headedness and headaches.       Objective:   There  were no vitals taken for this visit.  Patient Active Problem List   Diagnosis Date Noted  . Absolute anemia 07/12/2015  . Atrial flutter ([place]) 07/12/2015  . Bleeding ulcer 07/12/2015  . Acid reflux 07/12/2015  . H/O: pneumonia 07/12/2015  . History of urinary anomaly 07/12/2015  . Blood glucose elevated 07/12/2015  . BP (high blood pressure) 07/12/2015  . Scoliosis 07/12/2015  . Arthritis, degenerative 07/12/2015  . OP (osteoporosis) 07/12/2015  . Cervical pain 07/12/2015  . Avitaminosis D 07/12/2015  . Microcytic anemia 07/12/2015  . Hyperlipemia 05/30/2015  . Breathlessness on exertion 06/01/2014    Outpatient Encounter Prescriptions as of 11/19/2016  Medication Sig Note  . calcium carbonate (OS-CAL) 600 MG TABS tablet Take 600 mg by mouth daily with breakfast.  07/12/2015: Received from: [facility]  . esomeprazole (NEXIUM) 40 MG capsule Take by mouth as needed.  07/12/2015: Medication taken as needed.  Received from: [facility]  . hydrochlorothiazide (MICROZIDE) 12.5 MG capsule TAKE ONE CAPSULE BY MOUTH DAILY   . metoprolol (LOPRESSOR) 50 MG tablet Take 50 mg by mouth 2 (two) times daily.  07/12/2015: Please fill number 60 as may need to titrate up. Received from: [facility]  . MULTIPLE VITAMIN PO Take by mouth. 07/12/2015: Received from: [facility]  . rosuvastatin (CRESTOR) 5 MG tablet TAKE ONE (1) TABLET BY MOUTH EVERY DAY    No facility-administered encounter medications on file as of 11/19/2016.     Allergies  Allergen Reactions  . Ace Inhibitors Cough  . Aspirin     Bleeding ulcers.  . Salicylates     Other  reaction(s): Unknown       Physical Exam  Constitutional: She is oriented to person, place, and time. She appears well-developed and well-nourished.  HENT:  Mouth/Throat: No oropharyngeal exudate.  Neck: Neck supple.  Cardiovascular: Normal rate and regular rhythm.   Pulmonary/Chest:  Effort normal. No respiratory distress. She has wheezes. She has no rales. She exhibits no tenderness.  Some minimal wheezing in bilateral upper lung fields with accompanying ronchi that clear when patient coughs. Patient talking in complete sentences without pausing for breath.  Lymphadenopathy:    She has no cervical adenopathy.  Neurological: She is alert and oriented to person, place, and time.       Assessment & Plan:   1. URI with cough and congestion  Afebrile, nontoxic patient presenting with above. Do suspect bronchitis, but with patient's age and duration of symptoms, will get CXR to evaluate for pneumonia. Will also treat with doxycycline to cover both urinary pathogens and respiratory pathogens. Tessalon perles at night to help with sleep.  - DG Chest 2 View; Future - doxycycline (VIBRA-TABS) 100 MG tablet; Take 1 tablet (100 mg total) by mouth 2 (two) times daily.  Dispense: 14 tablet; Refill: 0 - benzonatate (TESSALON) 100 MG capsule; Take 1 capsule (100 mg total) by mouth 3 (three) times daily as needed for cough.  Dispense: 20 capsule; Refill: 0  2. Cystitis  Simple cystitis, treat as below. Will await culture for sensitivities.  - doxycycline (VIBRA-TABS) 100 MG tablet; Take 1 tablet (100 mg total) by mouth 2 (two) times daily.  Dispense: 14 tablet; Refill: 0  3. History of recurrent UTI (urinary tract infection)  - POCT urinalysis dipstick - Urine Culture  Return if symptoms worsen or fail to improve.   Patient Instructions  Urinary Tract Infection, Adult A urinary tract infection (UTI) is an infection of any part of the urinary tract, which includes the kidneys, ureters, bladder, and urethra. These organs make, store, and get rid of urine in the body. UTI can be a bladder infection (cystitis) or kidney infection (pyelonephritis). What are the causes? This infection may be caused by fungi, viruses, or bacteria. Bacteria are the most common cause of UTIs. This  condition can also be caused by repeated incomplete emptying of the bladder during urination. What increases the risk? This condition is more likely to develop if:  You ignore your need to urinate or hold urine for long periods of time.  You do not empty your bladder completely during urination.  You wipe back to front after urinating or having a bowel movement, if you are female.  You are uncircumcised, if you are female.  You are constipated.  You have a urinary catheter that stays in place (indwelling).  You have a weak defense (immune) system.  You have a medical condition that affects your bowels, kidneys, or bladder.  You have diabetes.  You take antibiotic medicines frequently or for long periods of time, and the antibiotics no longer work well against certain types of infections (antibiotic resistance).  You take medicines that irritate your urinary tract.  You are exposed to chemicals that irritate your urinary tract.  You are female. What are the signs or symptoms? Symptoms of this condition include:  Fever.  Frequent urination or passing small amounts of urine frequently.  Needing to urinate urgently.  Pain or burning with urination.  Urine that smells bad or unusual.  Cloudy urine.  Pain in the lower abdomen or back.  Trouble urinating.  Blood  in the urine.  Vomiting or being less hungry than normal.  Diarrhea or abdominal pain.  Vaginal discharge, if you are female. How is this diagnosed? This condition is diagnosed with a medical history and physical exam. You will also need to provide a urine sample to test your urine. Other tests may be done, including:  Blood tests.  Sexually transmitted disease (STD) testing. If you have had more than one UTI, a cystoscopy or imaging studies may be done to determine the cause of the infections. How is this treated? Treatment for this condition often includes a combination of two or more of the  following:  Antibiotic medicine.  Other medicines to treat less common causes of UTI.  Over-the-counter medicines to treat pain.  Drinking enough water to stay hydrated. Follow these instructions at home:  Take over-the-counter and prescription medicines only as told by your health care provider.  If you were prescribed an antibiotic, take it as told by your health care provider. Do not stop taking the antibiotic even if you start to feel better.  Avoid alcohol, caffeine, tea, and carbonated beverages. They can irritate your bladder.  Drink enough fluid to keep your urine clear or pale yellow.  Keep all follow-up visits as told by your health care provider. This is important.  Make sure to:  Empty your bladder often and completely. Do not hold urine for long periods of time.  Empty your bladder before and after sex.  Wipe from front to back after a bowel movement if you are female. Use each tissue one time when you wipe. Contact a health care provider if:  You have back pain.  You have a fever.  You feel nauseous or vomit.  Your symptoms do not get better after 3 days.  Your symptoms go away and then return. Get help right away if:  You have severe back pain or lower abdominal pain.  You are vomiting and cannot keep down any medicines or water. This information is not intended to replace advice given to you by your health care provider. Make sure you discuss any questions you have with your health care provider. Document Released: 07/25/2005 Document Revised: 03/28/2016 Document Reviewed: 09/05/2015 Elsevier Interactive Patient Education  2017 [employer].    The entirety of the information documented in the History of Present Illness, Review of Systems and Physical Exam were personally obtained by me. Portions of this information were initially documented by Ashley Royalty, CMA and reviewed by me for thoroughness and accuracy.

## 2016-11-19 NOTE — Patient Instructions (Signed)

## 2016-11-19 NOTE — Telephone Encounter (Signed)
-----   Message from Trinna Post, [place] sent at 11/19/2016  3:58 PM EST ----- No pneumonia. Please still take doxycycline for UTI thank you.

## 2016-11-21 LAB — URINE CULTURE

## 2016-11-22 ENCOUNTER — Telehealth: Payer: Self-pay

## 2016-11-22 NOTE — Telephone Encounter (Signed)
Patient advised as below. Patient verbalizes understanding and is in agreement with treatment plan.  

## 2016-11-22 NOTE — Telephone Encounter (Signed)
-----   Message from Trinna Post, [place] sent at 11/21/2016  1:52 PM EST ----- Urine culture did show E. Coli, sensitive to doxycycline. Hopefully patient has begun to feel better. Thank you.

## 2016-11-22 NOTE — Telephone Encounter (Signed)
LMTCB  Thanks,  -Okema Rollinson 

## 2016-12-05 ENCOUNTER — Ambulatory Visit (INDEPENDENT_AMBULATORY_CARE_PROVIDER_SITE_OTHER): Payer: Medicare HMO | Admitting: Physician Assistant

## 2016-12-05 VITALS — BP 160/84 | HR 82 | Temp 97.7°F | Resp 16 | Wt 176.0 lb

## 2016-12-05 DIAGNOSIS — R059 Cough, unspecified: Secondary | ICD-10-CM

## 2016-12-05 DIAGNOSIS — R05 Cough: Secondary | ICD-10-CM | POA: Diagnosis not present

## 2016-12-05 NOTE — Progress Notes (Signed)
Natalie Lucas  MRN: [id number] DOB: 1927/04/09  Subjective:  HPI  The patient is an 83 year old female who presents for follow up after treatment for bronchitis.  The patient was last seen on 11/19/16 and had chest xray and was treated with doxycycline and cough medicine.  Her x-ray was negative. The patient states she is doing better.  She still does not have her energy level back up to normal and is still a little short of breath.  She is still coughing a little bit and describes it as a dry cough.  She also had a UTI which was sensitive to doxycycline and is now improved  Patient Active Problem List   Diagnosis Date Noted  . Absolute anemia 07/12/2015  . Atrial flutter ([place]) 07/12/2015  . Bleeding ulcer 07/12/2015  . Acid reflux 07/12/2015  . H/O: pneumonia 07/12/2015  . History of urinary anomaly 07/12/2015  . Blood glucose elevated 07/12/2015  . BP (high blood pressure) 07/12/2015  . Scoliosis 07/12/2015  . Arthritis, degenerative 07/12/2015  . OP (osteoporosis) 07/12/2015  . Cervical pain 07/12/2015  . Avitaminosis D 07/12/2015  . Microcytic anemia 07/12/2015  . Hyperlipemia 05/30/2015  . Breathlessness on exertion 06/01/2014    No past medical history on file.  Social History   Social History  . Marital status: Widowed    Spouse name: N/A  . Number of children: 2  . Years of education: College   Occupational History  . Retired    Social History Main Topics  . Smoking status: Never Smoker  . Smokeless tobacco: Never Used  . Alcohol use No  . Drug use: No  . Sexual activity: Not on file   Other Topics Concern  . Not on file   Social History Narrative  . No narrative on file    Outpatient Encounter Prescriptions as of 12/05/2016  Medication Sig Note  . calcium carbonate (OS-CAL) 600 MG TABS tablet Take 600 mg by mouth daily with breakfast.  07/12/2015: Received from: [facility]  . esomeprazole (NEXIUM) 40 MG capsule Take by mouth as  needed.  07/12/2015: Medication taken as needed.  Received from: [facility]  . hydrochlorothiazide (MICROZIDE) 12.5 MG capsule TAKE ONE CAPSULE BY MOUTH DAILY   . metoprolol (LOPRESSOR) 50 MG tablet Take 50 mg by mouth 2 (two) times daily.  07/12/2015: Please fill number 60 as may need to titrate up. Received from: [facility]  . MULTIPLE VITAMIN PO Take by mouth. 07/12/2015: Received from: [facility]  . rosuvastatin (CRESTOR) 5 MG tablet TAKE ONE (1) TABLET BY MOUTH EVERY DAY    No facility-administered encounter medications on file as of 12/05/2016.     Allergies  Allergen Reactions  . Ace Inhibitors Cough  . Aspirin     Bleeding ulcers.  . Salicylates     Other reaction(s): Unknown    Review of Systems  Constitutional: Positive for malaise/fatigue. Negative for chills, diaphoresis and fever.  HENT: Negative for congestion, ear discharge, ear pain, hearing loss, nosebleeds, sinus pain, sore throat and tinnitus.   Eyes: Negative for blurred vision, double vision, photophobia, pain, discharge and redness.  Respiratory: Positive for cough and shortness of breath. Negative for hemoptysis, sputum production and wheezing.   Cardiovascular: Negative for chest pain, palpitations, orthopnea and leg swelling.  Gastrointestinal: Negative for abdominal pain, blood in stool, constipation, diarrhea, heartburn, melena, nausea and vomiting.  Musculoskeletal: Negative for myalgias.  Neurological: Positive for weakness. Negative for dizziness  and headaches.    Objective:  BP (!) 160/84 (BP Location: Left Arm, Patient Position: Sitting, Cuff Size: Normal)   Pulse 82   Temp 97.7 F (36.5 C) (Oral)   Resp 16   Wt 176 lb (79.8 kg)   SpO2 96%   BMI 29.29 kg/m   Physical Exam  Constitutional: She is well-developed, well-nourished, and in no distress.  Eyes: Conjunctivae are normal.  Neck: Neck supple.  Cardiovascular: Normal rate and regular  rhythm.   Pulmonary/Chest: Effort normal and breath sounds normal.  Abdominal: Soft. Bowel sounds are normal. She exhibits no distension. There is no tenderness. There is no rebound and no guarding.  Lymphadenopathy:    She has no cervical adenopathy.  Skin: Skin is warm and dry.  Psychiatric: Affect and judgment normal.  Vitals reviewed.   Assessment and Plan :  1. Cough  Patient is doing much better today. Both her respiratory symptoms and UTI symptoms have resolved.  No Follow-up on file.  There are no Patient Instructions on file for this visit.  The entirety of the information documented in the History of Present Illness, Review of Systems and Physical Exam were personally obtained by me. Portions of this information were initially documented by [facility] and reviewed by me for thoroughness and accuracy.    \

## 2016-12-05 NOTE — Patient Instructions (Signed)
Upper Respiratory Infection, Adult Most upper respiratory infections (URIs) are caused by a virus. A URI affects the nose, throat, and upper air passages. The most common type of URI is often called "the common cold." Follow these instructions at home:  Take medicines only as told by your doctor.  Gargle warm saltwater or take cough drops to comfort your throat as told by your doctor.  Use a warm mist humidifier or inhale steam from a shower to increase air moisture. This may make it easier to breathe.  Drink enough fluid to keep your pee (urine) clear or pale yellow.  Eat soups and other clear broths.  Have a healthy diet.  Rest as needed.  Go back to work when your fever is gone or your doctor says it is okay.  You may need to stay home longer to avoid giving your URI to others.  You can also wear a face mask and wash your hands often to prevent spread of the virus.  Use your inhaler more if you have asthma.  Do not use any tobacco products, including cigarettes, chewing tobacco, or electronic cigarettes. If you need help quitting, ask your doctor. Contact a doctor if:  You are getting worse, not better.  Your symptoms are not helped by medicine.  You have chills.  You are getting more short of breath.  You have brown or red mucus.  You have yellow or brown discharge from your nose.  You have pain in your face, especially when you bend forward.  You have a fever.  You have puffy (swollen) neck glands.  You have pain while swallowing.  You have white areas in the back of your throat. Get help right away if:  You have very bad or constant:  Headache.  Ear pain.  Pain in your forehead, behind your eyes, and over your cheekbones (sinus pain).  Chest pain.  You have long-lasting (chronic) lung disease and any of the following:  Wheezing.  Long-lasting cough.  Coughing up blood.  A change in your usual mucus.  You have a stiff neck.  You have  changes in your:  Vision.  Hearing.  Thinking.  Mood. This information is not intended to replace advice given to you by your health care provider. Make sure you discuss any questions you have with your health care provider. Document Released: 04/02/2008 Document Revised: 06/17/2016 Document Reviewed: 01/20/2014 Elsevier Interactive Patient Education  2017 Elsevier Inc.  

## 2017-01-02 ENCOUNTER — Other Ambulatory Visit: Payer: Self-pay | Admitting: Physician Assistant

## 2017-01-02 DIAGNOSIS — E785 Hyperlipidemia, unspecified: Secondary | ICD-10-CM

## 2017-01-02 NOTE — Telephone Encounter (Signed)
Crestor refilled to [facility]

## 2017-02-11 ENCOUNTER — Other Ambulatory Visit: Payer: Self-pay | Admitting: Physician Assistant

## 2017-02-11 NOTE — Telephone Encounter (Signed)
Pt called wanting to get a refill on the following medication at  [facility]. Thanks [facility]  metoprolol (LOPRESSOR) 50 MG tablet

## 2017-02-12 ENCOUNTER — Encounter: Payer: Self-pay | Admitting: Physician Assistant

## 2017-02-12 ENCOUNTER — Ambulatory Visit (INDEPENDENT_AMBULATORY_CARE_PROVIDER_SITE_OTHER): Payer: Medicare HMO | Admitting: Physician Assistant

## 2017-02-12 VITALS — BP 124/60 | HR 88 | Temp 98.2°F | Resp 16 | Wt 175.0 lb

## 2017-02-12 DIAGNOSIS — S46001A Unspecified injury of muscle(s) and tendon(s) of the rotator cuff of right shoulder, initial encounter: Secondary | ICD-10-CM | POA: Diagnosis not present

## 2017-02-12 NOTE — Patient Instructions (Signed)
Rotator Cuff Injury Rotator cuff injury is any type of injury to the set of muscles and tendons that make up the stabilizing unit of your shoulder. This unit holds the ball of your upper arm bone (humerus) in the socket of your shoulder blade (scapula). What are the causes? Injuries to your rotator cuff most commonly come from sports or activities that cause your arm to be moved repeatedly over your head. Examples of this include throwing, weight lifting, swimming, or racquet sports. Long lasting (chronic) irritation of your rotator cuff can cause soreness and swelling (inflammation), bursitis, and eventual damage to your tendons, such as a tear (rupture). What are the signs or symptoms? Acute rotator cuff tear:  Sudden tearing sensation followed by severe pain shooting from your upper shoulder down your arm toward your elbow.  Decreased range of motion of your shoulder because of pain and muscle spasm.  Severe pain.  Inability to raise your arm out to the side because of pain and loss of muscle power (large tears). Chronic rotator cuff tear:  Pain that usually is worse at night and may interfere with sleep.  Gradual weakness and decreased shoulder motion as the pain worsens.  Decreased range of motion. Rotator cuff tendinitis:  Deep ache in your shoulder and the outside upper arm over your shoulder.  Pain that comes on gradually and becomes worse when lifting your arm to the side or turning it inward. How is this diagnosed? Rotator cuff injury is diagnosed through a medical history, physical exam, and imaging exam. The medical history helps determine the type of rotator cuff injury. Your health care provider will look at your injured shoulder, feel the injured area, and ask you to move your shoulder in different positions. X-ray exams typically are done to rule out other causes of shoulder pain, such as fractures. MRI is the exam of choice for the most severe shoulder injuries because the  images show muscles and tendons. How is this treated? Chronic tear:  Medicine for pain, such as acetaminophen or ibuprofen.  Physical therapy and range-of-motion exercises may be helpful in maintaining shoulder function and strength.  Steroid injections into your shoulder joint.  Surgical repair of the rotator cuff if the injury does not heal with noninvasive treatment. Acute tear:  Anti-inflammatory medicines such as ibuprofen and naproxen to help reduce pain and swelling.  A sling to help support your arm and rest your rotator cuff muscles. Long-term use of a sling is not advised. It may cause significant stiffening of the shoulder joint.  Surgery may be considered within a few weeks, especially in younger, active people, to return the shoulder to full function.  Indications for surgical treatment include the following:  Age younger than 60 years.  Rotator cuff tears that are complete.  Physical therapy, rest, and anti-inflammatory medicines have been used for 6-8 weeks, with no improvement.  Employment or sporting activity that requires constant shoulder use. Tendinitis:  Anti-inflammatory medicines such as ibuprofen and naproxen to help reduce pain and swelling.  A sling to help support your arm and rest your rotator cuff muscles. Long-term use of a sling is not advised. It may cause significant stiffening of the shoulder joint.  Severe tendinitis may require:  Steroid injections into your shoulder joint.  Physical therapy.  Surgery. Follow these instructions at home:  Apply ice to your injury:  Put ice in a plastic bag.  Place a towel between your skin and the bag.  Leave the ice on for   20 minutes, 2-3 times a day.  If you have a shoulder immobilizer (sling and straps), wear it until told otherwise by your health care provider.  You may want to sleep on several pillows or in a recliner at night to lessen swelling and pain.  Only take over-the-counter or  prescription medicines for pain, discomfort, or fever as directed by your health care provider.  Do simple hand squeezing exercises with a soft rubber ball to decrease hand swelling. Contact a health care provider if:  Your shoulder pain increases, or new pain or numbness develops in your arm, hand, or fingers.  Your hand or fingers are colder than your other hand. Get help right away if:  Your arm, hand, or fingers are numb or tingling.  Your arm, hand, or fingers are increasingly swollen and painful, or they turn white or blue. This information is not intended to replace advice given to you by your health care provider. Make sure you discuss any questions you have with your health care provider. Document Released: 10/12/2000 Document Revised: 03/22/2016 Document Reviewed: 05/27/2013 Elsevier Interactive Patient Education  2017 [employer]vier Inc.  

## 2017-02-12 NOTE — Progress Notes (Signed)
Patient: Natalie Lucas Female    DOB: July 12, 1927   83 y.o.   MRN: [id number] Visit Date: 02/12/2017  Today's Provider: Trinna Post, PA-C   Chief Complaint  Patient presents with  . Arm Pain    Right uppper arm.  Started about a month ago.    Subjective:    Arm Pain   The incident occurred more than 1 week ago. There was no injury mechanism. The pain is present in the upper right arm. The quality of the pain is described as aching and stabbing. The pain radiates to the right hand (Pt says the pain does not radiate often.  ). The pain has been worsening since the incident. The symptoms are aggravated by lifting and movement. She has tried nothing for the symptoms.   Natalie Lucas is an 83 y/o woman with no prior history of right shoulder injury presenting today with right upper arm pain ongoing for one month. She does not remember an inciting event. The pain is in her upper right arm on the dorsal aspect. It is worse with movement. When she stops the aggravating movement, the pain improves.  No numbness, tingling, weakness,   Allergies  Allergen Reactions  . Ace Inhibitors Cough  . Aspirin     Bleeding ulcers.  . Salicylates     Other reaction(s): Unknown    Current Outpatient Prescriptions:  .  calcium carbonate (OS-CAL) 600 MG TABS tablet, Take 600 mg by mouth daily with breakfast. , Disp: , Rfl:  .  hydrochlorothiazide (MICROZIDE) 12.5 MG capsule, TAKE ONE CAPSULE BY MOUTH DAILY, Disp: 90 capsule, Rfl: 1 .  metoprolol (LOPRESSOR) 50 MG tablet, Take 50 mg by mouth 2 (two) times daily. , Disp: , Rfl:  .  MULTIPLE VITAMIN PO, Take by mouth., Disp: , Rfl:  .  rosuvastatin (CRESTOR) 5 MG tablet, TAKE ONE (1) TABLET BY MOUTH EVERY DAY, Disp: 90 tablet, Rfl: 1  Review of Systems  Constitutional: Negative.   Musculoskeletal: Positive for myalgias. Negative for arthralgias, back pain, gait problem, joint swelling, neck pain and neck stiffness.    Social History  Substance  Use Topics  . Smoking status: Never Smoker  . Smokeless tobacco: Never Used  . Alcohol use No   Objective:   BP 124/60 (BP Location: Left Arm, Patient Position: Sitting, Cuff Size: Large)   Pulse 88   Temp 98.2 F (36.8 C) (Oral)   Resp 16   Wt 175 lb (79.4 kg)   BMI 29.12 kg/m  Vitals:   02/12/17 1043  BP: 124/60  Pulse: 88  Resp: 16  Temp: 98.2 F (36.8 C)  TempSrc: Oral  Weight: 175 lb (79.4 kg)     Physical Exam  Constitutional: She is oriented to person, place, and time. She appears well-developed and well-nourished.  Musculoskeletal: Normal range of motion. She exhibits tenderness. She exhibits no edema or deformity.  Some tenderness along right AC joint. Positive Neer's. Pain with ROM above 90 degrees on right side, though can perform this. Mild crepitus in humeral joint. Mild pain with belly press, right weaker than left with motion against resistance. Positive lift off on right side. Strength 5/5 BUE, sensation grossly in tact.  Neurological: She is alert and oriented to person, place, and time. She has normal reflexes.  Skin: Skin is warm and dry.  Psychiatric: She has a normal mood and affect. Her behavior is normal.        Assessment &  Plan:     1. Injury of right rotator cuff, initial encounter  Possible subscapularis tendonitis/tear in rotator cuff or subacromial impingement based on exam. Counseled patient on further workup including XRAY other imaging, potentials for treatment including PT/pain relief, surgery. Agrees to ortho referral for further evaluation.   - Ambulatory referral to Orthopedics  The entirety of the information documented in the History of Present Illness, Review of Systems and Physical Exam were personally obtained by me. Portions of this information were initially documented by Ashley Royalty, CMA and reviewed by me for thoroughness and accuracy.   Return if symptoms worsen or fail to improve.        Trinna Post, PA-C    [facility]

## 2017-02-14 MED ORDER — METOPROLOL TARTRATE 50 MG PO TABS: 50.0000 mg | ORAL_TABLET | Freq: Two times a day (BID) | ORAL | 0 refills | Status: DC

## 2017-02-15 DIAGNOSIS — M7541 Impingement syndrome of right shoulder: Secondary | ICD-10-CM | POA: Diagnosis not present

## 2017-03-05 DIAGNOSIS — L821 Other seborrheic keratosis: Secondary | ICD-10-CM | POA: Diagnosis not present

## 2017-03-05 DIAGNOSIS — L820 Inflamed seborrheic keratosis: Secondary | ICD-10-CM | POA: Diagnosis not present

## 2017-03-05 DIAGNOSIS — L738 Other specified follicular disorders: Secondary | ICD-10-CM | POA: Diagnosis not present

## 2017-03-05 DIAGNOSIS — Z85828 Personal history of other malignant neoplasm of skin: Secondary | ICD-10-CM | POA: Diagnosis not present

## 2017-03-26 DIAGNOSIS — Z6828 Body mass index (BMI) 28.0-28.9, adult: Secondary | ICD-10-CM | POA: Diagnosis not present

## 2017-03-26 DIAGNOSIS — I4892 Unspecified atrial flutter: Secondary | ICD-10-CM | POA: Diagnosis not present

## 2017-03-26 DIAGNOSIS — M419 Scoliosis, unspecified: Secondary | ICD-10-CM | POA: Diagnosis not present

## 2017-03-26 DIAGNOSIS — Z79899 Other long term (current) drug therapy: Secondary | ICD-10-CM | POA: Diagnosis not present

## 2017-03-26 DIAGNOSIS — Z0000 Encounter for general adult medical examination without abnormal findings: Secondary | ICD-10-CM | POA: Diagnosis not present

## 2017-03-26 DIAGNOSIS — Z9849 Cataract extraction status, unspecified eye: Secondary | ICD-10-CM | POA: Diagnosis not present

## 2017-03-26 DIAGNOSIS — I10 Essential (primary) hypertension: Secondary | ICD-10-CM | POA: Diagnosis not present

## 2017-03-26 DIAGNOSIS — E7800 Pure hypercholesterolemia, unspecified: Secondary | ICD-10-CM | POA: Diagnosis not present

## 2017-05-16 DIAGNOSIS — E7800 Pure hypercholesterolemia, unspecified: Secondary | ICD-10-CM | POA: Diagnosis not present

## 2017-05-16 DIAGNOSIS — R0602 Shortness of breath: Secondary | ICD-10-CM | POA: Diagnosis not present

## 2017-05-16 DIAGNOSIS — I483 Typical atrial flutter: Secondary | ICD-10-CM | POA: Diagnosis not present

## 2017-05-16 DIAGNOSIS — I10 Essential (primary) hypertension: Secondary | ICD-10-CM | POA: Diagnosis not present

## 2017-06-21 ENCOUNTER — Ambulatory Visit (INDEPENDENT_AMBULATORY_CARE_PROVIDER_SITE_OTHER): Payer: Medicare HMO | Admitting: Physician Assistant

## 2017-06-21 ENCOUNTER — Other Ambulatory Visit: Payer: Self-pay

## 2017-06-21 VITALS — BP 132/84 | HR 71 | Temp 97.6°F | Wt 176.2 lb

## 2017-06-21 DIAGNOSIS — R739 Hyperglycemia, unspecified: Secondary | ICD-10-CM

## 2017-06-21 DIAGNOSIS — R8290 Unspecified abnormal findings in urine: Secondary | ICD-10-CM | POA: Diagnosis not present

## 2017-06-21 DIAGNOSIS — R81 Glycosuria: Secondary | ICD-10-CM

## 2017-06-21 DIAGNOSIS — M419 Scoliosis, unspecified: Secondary | ICD-10-CM

## 2017-06-21 DIAGNOSIS — M50321 Other cervical disc degeneration at C4-C5 level: Secondary | ICD-10-CM | POA: Insufficient documentation

## 2017-06-21 LAB — POCT URINALYSIS DIPSTICK
Bilirubin, UA: NEGATIVE
Blood, UA: NEGATIVE
Glucose, UA: 250
Ketones, UA: NEGATIVE
Leukocytes, UA: NEGATIVE
Nitrite, UA: NEGATIVE
Protein, UA: NEGATIVE
Spec Grav, UA: 1.01 (ref 1.010–1.025)
Urobilinogen, UA: 0.2 E.U./dL
pH, UA: 7.5 (ref 5.0–8.0)

## 2017-06-21 LAB — POCT GLYCOSYLATED HEMOGLOBIN (HGB A1C): Hemoglobin A1C: 5.7

## 2017-06-21 MED ORDER — AMOXICILLIN-POT CLAVULANATE 875-125 MG PO TABS: 1.0000 | ORAL_TABLET | Freq: Two times a day (BID) | ORAL | 0 refills | Status: AC

## 2017-06-21 NOTE — Patient Instructions (Addendum)
Contact [facility] for orthotics. If you need [place], call us back for referral.   Urinary Tract Infection, Adult A urinary tract infection (UTI) is an infection of any part of the urinary tract, which includes the kidneys, ureters, bladder, and urethra. These organs make, store, and get rid of urine in the body. UTI can be a bladder infection (cystitis) or kidney infection (pyelonephritis). What are the causes? This infection may be caused by fungi, viruses, or bacteria. Bacteria are the most common cause of UTIs. This condition can also be caused by repeated incomplete emptying of the bladder during urination. What increases the risk? This condition is more likely to develop if:  You ignore your need to urinate or hold urine for long periods of time.  You do not empty your bladder completely during urination.  You wipe back to front after urinating or having a bowel movement, if you are female.  You are uncircumcised, if you are female.  You are constipated.  You have a urinary catheter that stays in place (indwelling).  You have a weak defense (immune) system.  You have a medical condition that affects your bowels, kidneys, or bladder.  You have diabetes.  You take antibiotic medicines frequently or for long periods of time, and the antibiotics no longer work well against certain types of infections (antibiotic resistance).  You take medicines that irritate your urinary tract.  You are exposed to chemicals that irritate your urinary tract.  You are female.  What are the signs or symptoms? Symptoms of this condition include:  Fever.  Frequent urination or passing small amounts of urine frequently.  Needing to urinate urgently.  Pain or burning with urination.  Urine that smells bad or unusual.  Cloudy urine.  Pain in the lower abdomen or back.  Trouble urinating.  Blood in the urine.  Vomiting or being less hungry than normal.  Diarrhea or abdominal  pain.  Vaginal discharge, if you are female.  How is this diagnosed? This condition is diagnosed with a medical history and physical exam. You will also need to provide a urine sample to test your urine. Other tests may be done, including:  Blood tests.  Sexually transmitted disease (STD) testing.  If you have had more than one UTI, a cystoscopy or imaging studies may be done to determine the cause of the infections. How is this treated? Treatment for this condition often includes a combination of two or more of the following:  Antibiotic medicine.  Other medicines to treat less common causes of UTI.  Over-the-counter medicines to treat pain.  Drinking enough water to stay hydrated.  Follow these instructions at home:  Take over-the-counter and prescription medicines only as told by your health care provider.  If you were prescribed an antibiotic, take it as told by your health care provider. Do not stop taking the antibiotic even if you start to feel better.  Avoid alcohol, caffeine, tea, and carbonated beverages. They can irritate your bladder.  Drink enough fluid to keep your urine clear or pale yellow.  Keep all follow-up visits as told by your health care provider. This is important.  Make sure to: ? Empty your bladder often and completely. Do not hold urine for long periods of time. ? Empty your bladder before and after sex. ? Wipe from front to back after a bowel movement if you are female. Use each tissue one time when you wipe. Contact a health care provider if:  You have back pain.  You have a fever.  You feel nauseous or vomit.  Your symptoms do not get better after 3 days.  Your symptoms go away and then return. Get help right away if:  You have severe back pain or lower abdominal pain.  You are vomiting and cannot keep down any medicines or water. This information is not intended to replace advice given to you by your health care provider. Make sure  you discuss any questions you have with your health care provider. Document Released: 07/25/2005 Document Revised: 03/28/2016 Document Reviewed: 09/05/2015 Elsevier Interactive Patient Education  2017 [employer].

## 2017-06-21 NOTE — Progress Notes (Signed)
Patient: Natalie Lucas Female    DOB: 05-Oct-1927   83 y.o.   MRN: [id number] Visit Date: 06/21/2017  Today's Provider: Trinna Post, PA-C   Chief Complaint  Patient presents with  . Urinary Tract Infection   Subjective:      Natalie Lucas with several days of malodorous urine and bladder pressure. No dysuria, flank pain, fevers, chills, n/v.   Urinary Tract Infection   This is a recurrent problem. The current episode started yesterday. The problem has been unchanged. The patient is experiencing no pain. There has been no fever. Associated symptoms comments: Cloudy urine and foul odor. She has tried nothing for the symptoms.      Previous Medications   CALCIUM CARBONATE (OS-CAL) 600 MG TABS TABLET    Take 600 mg by mouth daily with breakfast.    HYDROCHLOROTHIAZIDE (MICROZIDE) 12.5 MG CAPSULE    TAKE ONE CAPSULE BY MOUTH DAILY   METOPROLOL (LOPRESSOR) 50 MG TABLET    Take 1 tablet (50 mg total) by mouth 2 (two) times daily.   MULTIPLE VITAMIN PO    Take by mouth.   ROSUVASTATIN (CRESTOR) 5 MG TABLET    TAKE ONE (1) TABLET BY MOUTH EVERY DAY    Review of Systems  Constitutional: Negative.   Respiratory: Negative.   Cardiovascular: Negative.   Genitourinary: Negative.        Cloudy urine and foul odor    Social History  Substance Use Topics  . Smoking status: Never Smoker  . Smokeless tobacco: Never Used  . Alcohol use No   Objective:   BP 132/84 (BP Location: Left Arm, Patient Position: Sitting, Cuff Size: Normal)   Pulse 71   Temp 97.6 F (36.4 C) (Oral)   Wt 176 lb 3.2 oz (79.9 kg)   SpO2 96%   BMI 29.32 kg/m   Physical Exam  Constitutional: She is oriented to person, place, and time. She appears well-developed and well-nourished.  Abdominal: Soft. Bowel sounds are normal. There is no tenderness. There is no CVA tenderness.  Neurological: She is alert and oriented to person, place, and time.  Skin: Skin is warm and dry.  Psychiatric: She has a normal mood  and affect. Her behavior is normal.        Assessment & Plan:     1. Cloudy urine  Some glucose in urine, otherwise normal.  - POCT Urinalysis Dipstick - CULTURE, URINE COMPREHENSIVE - amoxicillin-clavulanate (AUGMENTIN) 875-125 MG tablet; Take 1 tablet by mouth 2 (two) times daily.  Dispense: 10 tablet; Refill: 0  2. Bad odor of urine  - POCT Urinalysis Dipstick - CULTURE, URINE COMPREHENSIVE - amoxicillin-clavulanate (AUGMENTIN) 875-125 MG tablet; Take 1 tablet by mouth 2 (two) times daily.  Dispense: 10 tablet; Refill: 0  3. Blood glucose elevated  5.7% today.   - POCT HgB A1C  4. Glucose found in urine on examination  - POCT HgB A1C  5. Scoliosis, unspecified scoliosis type, unspecified spinal region  Patient asking about referral for orthotics 2/2 leg discrepancy from scoliosis. She has been seen at [facility] and I suggested she contact them first as she is a patient there. If she needs a referral to [facility] or elsewhere, would be happy to provide.  The entirety of the information documented in the History of Present Illness, Review of Systems and Physical Exam were personally obtained by me. Portions of this information were initially documented by [facility] and reviewed by me for thoroughness and accuracy.  Follow up: Return if symptoms worsen or fail to improve.

## 2017-06-25 ENCOUNTER — Telehealth: Payer: Self-pay

## 2017-06-25 LAB — CULTURE, URINE COMPREHENSIVE

## 2017-06-25 NOTE — Telephone Encounter (Signed)
-----   Message from Trinna Post, [place] sent at 06/25/2017 12:00 PM EDT ----- Urine culture negative. She may stop abx if she wishes or complete course if she feels it has helped her.

## 2017-06-25 NOTE — Telephone Encounter (Signed)
Pt advised.   Thanks,   -Alysse Rathe  

## 2017-07-07 ENCOUNTER — Other Ambulatory Visit: Payer: Self-pay | Admitting: Physician Assistant

## 2017-07-07 DIAGNOSIS — E785 Hyperlipidemia, unspecified: Secondary | ICD-10-CM

## 2017-07-26 DIAGNOSIS — R69 Illness, unspecified: Secondary | ICD-10-CM | POA: Diagnosis not present

## 2017-09-10 DIAGNOSIS — D485 Neoplasm of uncertain behavior of skin: Secondary | ICD-10-CM | POA: Diagnosis not present

## 2017-09-10 DIAGNOSIS — Z85828 Personal history of other malignant neoplasm of skin: Secondary | ICD-10-CM | POA: Diagnosis not present

## 2017-09-10 DIAGNOSIS — L570 Actinic keratosis: Secondary | ICD-10-CM | POA: Diagnosis not present

## 2017-09-10 DIAGNOSIS — L820 Inflamed seborrheic keratosis: Secondary | ICD-10-CM | POA: Diagnosis not present

## 2017-09-10 DIAGNOSIS — Z1283 Encounter for screening for malignant neoplasm of skin: Secondary | ICD-10-CM | POA: Diagnosis not present

## 2017-09-10 DIAGNOSIS — B078 Other viral warts: Secondary | ICD-10-CM | POA: Diagnosis not present

## 2017-09-10 DIAGNOSIS — L821 Other seborrheic keratosis: Secondary | ICD-10-CM | POA: Diagnosis not present

## 2017-09-10 DIAGNOSIS — C44519 Basal cell carcinoma of skin of other part of trunk: Secondary | ICD-10-CM | POA: Diagnosis not present

## 2017-09-23 ENCOUNTER — Other Ambulatory Visit: Payer: Self-pay | Admitting: Physician Assistant

## 2017-10-30 ENCOUNTER — Other Ambulatory Visit: Payer: Self-pay | Admitting: Physician Assistant

## 2017-11-04 DIAGNOSIS — L821 Other seborrheic keratosis: Secondary | ICD-10-CM | POA: Diagnosis not present

## 2017-11-04 DIAGNOSIS — L570 Actinic keratosis: Secondary | ICD-10-CM | POA: Diagnosis not present

## 2017-11-04 DIAGNOSIS — C44519 Basal cell carcinoma of skin of other part of trunk: Secondary | ICD-10-CM | POA: Diagnosis not present

## 2017-11-18 DIAGNOSIS — R0602 Shortness of breath: Secondary | ICD-10-CM | POA: Diagnosis not present

## 2017-11-18 DIAGNOSIS — R531 Weakness: Secondary | ICD-10-CM | POA: Diagnosis not present

## 2017-11-18 DIAGNOSIS — I4892 Unspecified atrial flutter: Secondary | ICD-10-CM | POA: Diagnosis not present

## 2017-11-18 DIAGNOSIS — E785 Hyperlipidemia, unspecified: Secondary | ICD-10-CM | POA: Diagnosis not present

## 2017-11-18 DIAGNOSIS — I10 Essential (primary) hypertension: Secondary | ICD-10-CM | POA: Diagnosis not present

## 2017-11-25 DIAGNOSIS — I491 Atrial premature depolarization: Secondary | ICD-10-CM | POA: Diagnosis not present

## 2017-11-26 DIAGNOSIS — E785 Hyperlipidemia, unspecified: Secondary | ICD-10-CM | POA: Diagnosis not present

## 2017-11-26 DIAGNOSIS — R0602 Shortness of breath: Secondary | ICD-10-CM | POA: Diagnosis not present

## 2017-11-26 DIAGNOSIS — I4892 Unspecified atrial flutter: Secondary | ICD-10-CM | POA: Diagnosis not present

## 2017-11-26 DIAGNOSIS — I10 Essential (primary) hypertension: Secondary | ICD-10-CM | POA: Diagnosis not present

## 2018-02-25 ENCOUNTER — Ambulatory Visit: Payer: Medicare HMO | Admitting: Physician Assistant

## 2018-02-25 ENCOUNTER — Encounter: Payer: Self-pay | Admitting: Physician Assistant

## 2018-02-25 VITALS — BP 114/70 | HR 80 | Temp 97.6°F | Resp 16

## 2018-02-25 DIAGNOSIS — N3090 Cystitis, unspecified without hematuria: Secondary | ICD-10-CM | POA: Diagnosis not present

## 2018-02-25 DIAGNOSIS — R300 Dysuria: Secondary | ICD-10-CM

## 2018-02-25 LAB — POCT URINALYSIS DIPSTICK
Bilirubin, UA: NEGATIVE
Glucose, UA: 100
Ketones, UA: NEGATIVE
Spec Grav, UA: 1.02 (ref 1.010–1.025)
Urobilinogen, UA: 0.2 E.U./dL
pH, UA: 6.5 (ref 5.0–8.0)

## 2018-02-25 MED ORDER — SULFAMETHOXAZOLE-TRIMETHOPRIM 800-160 MG PO TABS: 1.0000 | ORAL_TABLET | Freq: Two times a day (BID) | ORAL | 0 refills | Status: AC

## 2018-02-25 NOTE — Patient Instructions (Signed)

## 2018-02-25 NOTE — Progress Notes (Signed)
[facility]  Chief Complaint  Patient presents with  . Urinary Tract Infection    Subjective:    Patient ID: Natalie Lucas, female    DOB: 1926-12-12, 84 y.o.   MRN: [id number]   Urinary Tract Infection: Patient complains of frequency and suprapubic pressure She has had symptoms for 4 days. Patient denies back pain, fever and vaginal discharge. Patient does have a history of recurrent UTI.  Patient does not have a history of pyelonephritis or other renal issues. Patient denies vaginal discharge and denies new sexual partners. The patient denies recent travel outside of the [place].  Review of Systems  Constitutional: Negative.   Gastrointestinal: Negative.   Genitourinary: Positive for dysuria, frequency and urgency. Negative for flank pain and hematuria.  Musculoskeletal: Negative.   Neurological: Negative for dizziness and headaches.       Objective:   BP 114/70 (BP Location: Right Arm, Patient Position: Sitting, Cuff Size: Large)   Pulse 80   Temp 97.6 F (36.4 C) (Oral)   Resp 16   Patient Active Problem List   Diagnosis Date Noted  . Degeneration of intervertebral disc at C4-C5 level 06/21/2017  . Absolute anemia 07/12/2015  . Atrial flutter ([place]) 07/12/2015  . Bleeding ulcer 07/12/2015  . Acid reflux 07/12/2015  . H/O: pneumonia 07/12/2015  . History of urinary anomaly 07/12/2015  . Blood glucose elevated 07/12/2015  . BP (high blood pressure) 07/12/2015  . Scoliosis 07/12/2015  . Arthritis, degenerative 07/12/2015  . OP (osteoporosis) 07/12/2015  . Cervical pain 07/12/2015  . Avitaminosis D 07/12/2015  . Microcytic anemia 07/12/2015  . Hyperlipemia 05/30/2015  . Breathlessness on exertion 06/01/2014    Outpatient Encounter Medications as of 02/25/2018  Medication Sig Note  . calcium carbonate (OS-CAL) 600 MG TABS tablet Take 600 mg by mouth daily with breakfast.  07/12/2015: Received from: [facility]  . hydrochlorothiazide (MICROZIDE) 12.5 MG capsule TAKE ONE CAPSULE BY MOUTH DAILY   . metoprolol tartrate (LOPRESSOR) 50 MG tablet TAKE ONE TABLET BY MOUTH TWICE DAILY.   . MULTIPLE VITAMIN PO Take by mouth. 07/12/2015: Received from: [facility]  . rosuvastatin (CRESTOR) 5 MG tablet TAKE ONE TABLET BY MOUTH EVERY DAY   . sulfamethoxazole-trimethoprim (BACTRIM DS) 800-160 MG tablet Take 1 tablet by mouth 2 (two) times daily for 5 days.    No facility-administered encounter medications on file as of 02/25/2018.     Allergies  Allergen Reactions  . Ace Inhibitors Cough  . Aspirin     Bleeding ulcers.  . Salicylates     Other reaction(s): Unknown       Physical Exam  Constitutional: She is oriented to person, place, and time. She appears well-developed and well-nourished. No distress.  Cardiovascular: Normal rate and regular rhythm.  Pulmonary/Chest: Effort normal and breath sounds normal.  Abdominal: Soft. Bowel sounds are normal. She exhibits no distension. There is tenderness in the suprapubic area. There is no rebound, no guarding and no CVA tenderness.  Neurological: She is alert and oriented to person, place, and time.  Skin: Skin is warm and dry. She is not diaphoretic.  Psychiatric: She has a normal mood and affect. Her behavior is normal.       Assessment & Plan:  1. Cystitis  Treat as below. Will culture and adjust as necessary. Return precautions counseled.   - sulfamethoxazole-trimethoprim (BACTRIM DS) 800-160 MG tablet; Take 1 tablet by mouth 2 (two) times daily for 5 days.  Dispense: 10 tablet; Refill: 0  2. Dysuria  - POCT Urinalysis Dipstick - CULTURE, URINE COMPREHENSIVE   Patient Instructions  Urinary Tract Infection, Adult A urinary tract infection (UTI) is an infection of any part of the urinary tract, which includes the kidneys, ureters, bladder, and urethra. These organs make, store, and get rid of urine in the body. UTI can  be a bladder infection (cystitis) or kidney infection (pyelonephritis). What are the causes? This infection may be caused by fungi, viruses, or bacteria. Bacteria are the most common cause of UTIs. This condition can also be caused by repeated incomplete emptying of the bladder during urination. What increases the risk? This condition is more likely to develop if:  You ignore your need to urinate or hold urine for long periods of time.  You do not empty your bladder completely during urination.  You wipe back to front after urinating or having a bowel movement, if you are female.  You are uncircumcised, if you are female.  You are constipated.  You have a urinary catheter that stays in place (indwelling).  You have a weak defense (immune) system.  You have a medical condition that affects your bowels, kidneys, or bladder.  You have diabetes.  You take antibiotic medicines frequently or for long periods of time, and the antibiotics no longer work well against certain types of infections (antibiotic resistance).  You take medicines that irritate your urinary tract.  You are exposed to chemicals that irritate your urinary tract.  You are female.  What are the signs or symptoms? Symptoms of this condition include:  Fever.  Frequent urination or passing small amounts of urine frequently.  Needing to urinate urgently.  Pain or burning with urination.  Urine that smells bad or unusual.  Cloudy urine.  Pain in the lower abdomen or back.  Trouble urinating.  Blood in the urine.  Vomiting or being less hungry than normal.  Diarrhea or abdominal pain.  Vaginal discharge, if you are female.  How is this diagnosed? This condition is diagnosed with a medical history and physical exam. You will also need to provide a urine sample to test your urine. Other tests may be done, including:  Blood tests.  Sexually transmitted disease (STD) testing.  If you have had more  than one UTI, a cystoscopy or imaging studies may be done to determine the cause of the infections. How is this treated? Treatment for this condition often includes a combination of two or more of the following:  Antibiotic medicine.  Other medicines to treat less common causes of UTI.  Over-the-counter medicines to treat pain.  Drinking enough water to stay hydrated.  Follow these instructions at home:  Take over-the-counter and prescription medicines only as told by your health care provider.  If you were prescribed an antibiotic, take it as told by your health care provider. Do not stop taking the antibiotic even if you start to feel better.  Avoid alcohol, caffeine, tea, and carbonated beverages. They can irritate your bladder.  Drink enough fluid to keep your urine clear or pale yellow.  Keep all follow-up visits as told by your health care provider. This is important.  Make sure to: ? Empty your bladder often and completely. Do not hold urine for long periods of time. ? Empty your bladder before and after sex. ? Wipe from front to back after a bowel movement if you are female. Use each tissue one time when you wipe. Contact a health care  provider if:  You have back pain.  You have a fever.  You feel nauseous or vomit.  Your symptoms do not get better after 3 days.  Your symptoms go away and then return. Get help right away if:  You have severe back pain or lower abdominal pain.  You are vomiting and cannot keep down any medicines or water. This information is not intended to replace advice given to you by your health care provider. Make sure you discuss any questions you have with your health care provider. Document Released: 07/25/2005 Document Revised: 03/28/2016 Document Reviewed: 09/05/2015 Elsevier Interactive Patient Education  [employer].

## 2018-02-28 ENCOUNTER — Telehealth: Payer: Self-pay

## 2018-02-28 LAB — CULTURE, URINE COMPREHENSIVE

## 2018-02-28 NOTE — Telephone Encounter (Signed)
-----   Message from Trinna Post, [place] sent at 02/28/2018 11:43 AM EDT ----- Urine culture positive for organism that was sensitive to bactrim prescribed. How is she feeling?

## 2018-02-28 NOTE — Telephone Encounter (Signed)
Tried calling; no answer.   Thanks,   -Laura  

## 2018-03-03 ENCOUNTER — Encounter: Payer: Self-pay | Admitting: Physician Assistant

## 2018-03-03 ENCOUNTER — Ambulatory Visit: Payer: Medicare HMO | Admitting: Physician Assistant

## 2018-03-03 VITALS — BP 112/66 | HR 88 | Temp 98.1°F | Resp 16

## 2018-03-03 DIAGNOSIS — N3090 Cystitis, unspecified without hematuria: Secondary | ICD-10-CM

## 2018-03-03 DIAGNOSIS — R300 Dysuria: Secondary | ICD-10-CM | POA: Diagnosis not present

## 2018-03-03 LAB — POCT URINALYSIS DIPSTICK
Bilirubin, UA: NEGATIVE
Glucose, UA: NEGATIVE
Ketones, UA: NEGATIVE
Nitrite, UA: NEGATIVE
Protein, UA: NEGATIVE
Spec Grav, UA: 1.025 (ref 1.010–1.025)
Urobilinogen, UA: 0.2 E.U./dL
pH, UA: 7 (ref 5.0–8.0)

## 2018-03-03 MED ORDER — AMOXICILLIN-POT CLAVULANATE 875-125 MG PO TABS: 1.0000 | ORAL_TABLET | Freq: Two times a day (BID) | ORAL | 0 refills | Status: AC

## 2018-03-03 NOTE — Telephone Encounter (Signed)
Spoke with pt.  She reports feeling better but is still having pressure.  She would like to have her urine recheck since she is going out of town later this week.   Thanks,   -Mickel Baas

## 2018-03-03 NOTE — Patient Instructions (Signed)

## 2018-03-03 NOTE — Telephone Encounter (Signed)
OV scheduled.  °

## 2018-03-03 NOTE — Progress Notes (Signed)
[place]  Chief Complaint  Patient presents with  . Urinary Tract Infection    Subjective:    Patient ID: Natalie Lucas, female    DOB: Jul 04, 1927, 84 y.o.   MRN: [id number]   Urinary Tract Infection: Patient complains of frequency and pressure She has had symptoms for 10 days. Her urine dipstick and culture from 02/25/2018 was positive for pansensitive E. Coli. She was treated with five days of Bactrim. She says she still has some burning pain and wants to have urine rechecked and treated because she is going on a trip out of state. Patient denies cough, stomach ache and vaginal discharge. Patient does have a history of recurrent UTI.  Patient does not have a history of pyelonephritis or other renal issues. Patient denies vaginal discharge and denies new sexual partners. The patient denies recent travel outside of the [place].  Review of Systems  Constitutional: Positive for malaise/fatigue. Negative for chills, diaphoresis, fever and weight loss.  Gastrointestinal: Negative for abdominal pain, blood in stool, constipation, diarrhea, heartburn, melena, nausea and vomiting.  Genitourinary: Positive for dysuria and frequency. Negative for flank pain, hematuria and urgency.       Objective:   BP 112/66 (BP Location: Right Arm, Patient Position: Sitting, Cuff Size: Large)   Pulse 88   Temp 98.1 F (36.7 C) (Oral)   Resp 16   Patient Active Problem List   Diagnosis Date Noted  . Degeneration of intervertebral disc at C4-C5 level 06/21/2017  . Absolute anemia 07/12/2015  . Atrial flutter ([place]) 07/12/2015  . Bleeding ulcer 07/12/2015  . Acid reflux 07/12/2015  . H/O: pneumonia 07/12/2015  . History of urinary anomaly 07/12/2015  . Blood glucose elevated 07/12/2015  . BP (high blood pressure) 07/12/2015  . Scoliosis 07/12/2015  . Arthritis, degenerative 07/12/2015  . OP (osteoporosis) 07/12/2015  . Cervical pain 07/12/2015  .  Avitaminosis D 07/12/2015  . Microcytic anemia 07/12/2015  . Hyperlipemia 05/30/2015  . Breathlessness on exertion 06/01/2014    Outpatient Encounter Medications as of 03/03/2018  Medication Sig Note  . calcium carbonate (OS-CAL) 600 MG TABS tablet Take 1,200 mg by mouth daily with breakfast.  07/12/2015: Received from: [facility]  . hydrochlorothiazide (MICROZIDE) 12.5 MG capsule TAKE ONE CAPSULE BY MOUTH DAILY   . metoprolol tartrate (LOPRESSOR) 50 MG tablet TAKE ONE TABLET BY MOUTH TWICE DAILY.   . MULTIPLE VITAMIN PO Take by mouth. 07/12/2015: Received from: [facility]  . rosuvastatin (CRESTOR) 5 MG tablet TAKE ONE TABLET BY MOUTH EVERY DAY   . amoxicillin-clavulanate (AUGMENTIN) 875-125 MG tablet Take 1 tablet by mouth 2 (two) times daily for 7 days.    No facility-administered encounter medications on file as of 03/03/2018.     Allergies  Allergen Reactions  . Ace Inhibitors Cough  . Aspirin     Bleeding ulcers.  . Salicylates     Other reaction(s): Unknown       Physical Exam  Constitutional: She is oriented to person, place, and time. She appears well-developed and well-nourished.  Cardiovascular: Normal rate and regular rhythm.  Pulmonary/Chest: Effort normal and breath sounds normal.  Abdominal: Soft. Bowel sounds are normal. There is no CVA tenderness.  Neurological: She is alert and oriented to person, place, and time.  Skin: Skin is warm and dry.  Psychiatric: She has a normal mood and affect. Her behavior is normal.       Assessment & Plan:  1. Cystitis  Worsening. Treated before. Will check again. She says Bactrim made her tired. She suggests Cipro, but I think given her age and mobility status that this is inappropriate. Will try Augmentin, which was effective on last culture.  - CULTURE, URINE COMPREHENSIVE - amoxicillin-clavulanate (AUGMENTIN) 875-125 MG tablet; Take 1 tablet by mouth 2 (two) times daily for 7 days.   Dispense: 14 tablet; Refill: 0 - POCT urinalysis dipstick  Return if symptoms worsen or fail to improve.  The entirety of the information documented in the History of Present Illness, Review of Systems and Physical Exam were personally obtained by me. Portions of this information were initially documented by Ashley Royalty, CMA and reviewed by me for thoroughness and accuracy.    Patient Instructions  Urinary Tract Infection, Adult A urinary tract infection (UTI) is an infection of any part of the urinary tract, which includes the kidneys, ureters, bladder, and urethra. These organs make, store, and get rid of urine in the body. UTI can be a bladder infection (cystitis) or kidney infection (pyelonephritis). What are the causes? This infection may be caused by fungi, viruses, or bacteria. Bacteria are the most common cause of UTIs. This condition can also be caused by repeated incomplete emptying of the bladder during urination. What increases the risk? This condition is more likely to develop if:  You ignore your need to urinate or hold urine for long periods of time.  You do not empty your bladder completely during urination.  You wipe back to front after urinating or having a bowel movement, if you are female.  You are uncircumcised, if you are female.  You are constipated.  You have a urinary catheter that stays in place (indwelling).  You have a weak defense (immune) system.  You have a medical condition that affects your bowels, kidneys, or bladder.  You have diabetes.  You take antibiotic medicines frequently or for long periods of time, and the antibiotics no longer work well against certain types of infections (antibiotic resistance).  You take medicines that irritate your urinary tract.  You are exposed to chemicals that irritate your urinary tract.  You are female.  What are the signs or symptoms? Symptoms of this condition include:  Fever.  Frequent urination or  passing small amounts of urine frequently.  Needing to urinate urgently.  Pain or burning with urination.  Urine that smells bad or unusual.  Cloudy urine.  Pain in the lower abdomen or back.  Trouble urinating.  Blood in the urine.  Vomiting or being less hungry than normal.  Diarrhea or abdominal pain.  Vaginal discharge, if you are female.  How is this diagnosed? This condition is diagnosed with a medical history and physical exam. You will also need to provide a urine sample to test your urine. Other tests may be done, including:  Blood tests.  Sexually transmitted disease (STD) testing.  If you have had more than one UTI, a cystoscopy or imaging studies may be done to determine the cause of the infections. How is this treated? Treatment for this condition often includes a combination of two or more of the following:  Antibiotic medicine.  Other medicines to treat less common causes of UTI.  Over-the-counter medicines to treat pain.  Drinking enough water to stay hydrated.  Follow these instructions at home:  Take over-the-counter and prescription medicines only as told by your health care provider.  If you were prescribed an antibiotic, take it as told by your health care provider. Do  not stop taking the antibiotic even if you start to feel better.  Avoid alcohol, caffeine, tea, and carbonated beverages. They can irritate your bladder.  Drink enough fluid to keep your urine clear or pale yellow.  Keep all follow-up visits as told by your health care provider. This is important.  Make sure to: ? Empty your bladder often and completely. Do not hold urine for long periods of time. ? Empty your bladder before and after sex. ? Wipe from front to back after a bowel movement if you are female. Use each tissue one time when you wipe. Contact a health care provider if:  You have back pain.  You have a fever.  You feel nauseous or vomit.  Your symptoms do  not get better after 3 days.  Your symptoms go away and then return. Get help right away if:  You have severe back pain or lower abdominal pain.  You are vomiting and cannot keep down any medicines or water. This information is not intended to replace advice given to you by your health care provider. Make sure you discuss any questions you have with your health care provider. Document Released: 07/25/2005 Document Revised: 03/28/2016 Document Reviewed: 09/05/2015 Elsevier Interactive Patient Education  [employer].

## 2018-03-05 LAB — CULTURE, URINE COMPREHENSIVE

## 2018-03-06 ENCOUNTER — Telehealth: Payer: Self-pay

## 2018-03-06 NOTE — Telephone Encounter (Signed)
-----   Message from Trinna Post, [place] sent at 03/05/2018  4:57 PM EDT ----- Urine culture negative. Stop abx.

## 2018-03-06 NOTE — Telephone Encounter (Signed)
LMTCB 03/06/2018  Thanks,   -Mickel Baas

## 2018-03-07 NOTE — Telephone Encounter (Signed)
[id number] 

## 2018-03-11 ENCOUNTER — Ambulatory Visit (INDEPENDENT_AMBULATORY_CARE_PROVIDER_SITE_OTHER): Payer: Medicare HMO

## 2018-03-11 ENCOUNTER — Encounter: Payer: Self-pay | Admitting: Physician Assistant

## 2018-03-11 ENCOUNTER — Ambulatory Visit (INDEPENDENT_AMBULATORY_CARE_PROVIDER_SITE_OTHER): Payer: Medicare HMO | Admitting: Physician Assistant

## 2018-03-11 VITALS — BP 128/66 | HR 66 | Temp 98.1°F | Ht 65.0 in | Wt 180.0 lb

## 2018-03-11 DIAGNOSIS — D509 Iron deficiency anemia, unspecified: Secondary | ICD-10-CM

## 2018-03-11 DIAGNOSIS — Z0001 Encounter for general adult medical examination with abnormal findings: Secondary | ICD-10-CM | POA: Diagnosis not present

## 2018-03-11 DIAGNOSIS — Z0000 Encounter for general adult medical examination without abnormal findings: Secondary | ICD-10-CM

## 2018-03-11 DIAGNOSIS — R7303 Prediabetes: Secondary | ICD-10-CM | POA: Diagnosis not present

## 2018-03-11 DIAGNOSIS — M810 Age-related osteoporosis without current pathological fracture: Secondary | ICD-10-CM

## 2018-03-11 DIAGNOSIS — I10 Essential (primary) hypertension: Secondary | ICD-10-CM | POA: Diagnosis not present

## 2018-03-11 DIAGNOSIS — E119 Type 2 diabetes mellitus without complications: Secondary | ICD-10-CM | POA: Insufficient documentation

## 2018-03-11 NOTE — Telephone Encounter (Signed)
Pt advised.   Thanks,   -Rashaun Curl  

## 2018-03-11 NOTE — Patient Instructions (Addendum)
Natalie Lucas , Thank you for taking time to come for your Medicare Wellness Visit. I appreciate your ongoing commitment to your health goals. Please review the following plan we discussed and let me know if I can assist you in the future.   Screening recommendations/referrals: Colonoscopy: Up to date Mammogram: Up to date Bone Density: Up to date Recommended yearly ophthalmology/optometry visit for glaucoma screening and checkup Recommended yearly dental visit for hygiene and checkup  Vaccinations: Influenza vaccine: N/A Pneumococcal vaccine: Up to date Tdap vaccine: Pt declines today.  Shingles vaccine: Pt declines today.     Advanced directives: Please bring a copy of your POA (Power of Attorney) and/or Living Will to your next appointment.   Conditions/risks identified: Recommend to cut portions in half and to eat 3 small meals a day with two healthy snacks in between.  Next appointment: 3:00 PM today with Carles Collet.   Preventive Care 82 Years and Older, Female Preventive care refers to lifestyle choices and visits with your health care provider that can promote health and wellness. What does preventive care include?  A yearly physical exam. This is also called an annual well check.  Dental exams once or twice a year.  Routine eye exams. Ask your health care provider how often you should have your eyes checked.  Personal lifestyle choices, including:  Daily care of your teeth and gums.  Regular physical activity.  Eating a healthy diet.  Avoiding tobacco and drug use.  Limiting alcohol use.  Practicing safe sex.  Taking low-dose aspirin every day.  Taking vitamin and mineral supplements as recommended by your health care provider. What happens during an annual well check? The services and screenings done by your health care provider during your annual well check will depend on your age, overall health, lifestyle risk factors, and family history of  disease. Counseling  Your health care provider may ask you questions about your:  Alcohol use.  Tobacco use.  Drug use.  Emotional well-being.  Home and relationship well-being.  Sexual activity.  Eating habits.  History of falls.  Memory and ability to understand (cognition).  Work and work Statistician.  Reproductive health. Screening  You may have the following tests or measurements:  Height, weight, and BMI.  Blood pressure.  Lipid and cholesterol levels. These may be checked every 5 years, or more frequently if you are over 68 years old.  Skin check.  Lung cancer screening. You may have this screening every year starting at age 61 if you have a 30-pack-year history of smoking and currently smoke or have quit within the past 15 years.  Fecal occult blood test (FOBT) of the stool. You may have this test every year starting at age 6.  Flexible sigmoidoscopy or colonoscopy. You may have a sigmoidoscopy every 5 years or a colonoscopy every 10 years starting at age 38.  Hepatitis C blood test.  Hepatitis B blood test.  Sexually transmitted disease (STD) testing.  Diabetes screening. This is done by checking your blood sugar (glucose) after you have not eaten for a while (fasting). You may have this done every 1-3 years.  Bone density scan. This is done to screen for osteoporosis. You may have this done starting at age 51.  Mammogram. This may be done every 1-2 years. Talk to your health care provider about how often you should have regular mammograms. Talk with your health care provider about your test results, treatment options, and if necessary, the need for more tests.  Vaccines  Your health care provider may recommend certain vaccines, such as:  Influenza vaccine. This is recommended every year.  Tetanus, diphtheria, and acellular pertussis (Tdap, Td) vaccine. You may need a Td booster every 10 years.  Zoster vaccine. You may need this after age  35.  Pneumococcal 13-valent conjugate (PCV13) vaccine. One dose is recommended after age 45.  Pneumococcal polysaccharide (PPSV23) vaccine. One dose is recommended after age 34. Talk to your health care provider about which screenings and vaccines you need and how often you need them. This information is not intended to replace advice given to you by your health care provider. Make sure you discuss any questions you have with your health care provider. Document Released: 11/11/2015 Document Revised: 07/04/2016 Document Reviewed: 08/16/2015 Elsevier Interactive Patient Education  2017 [place] Prevention in the Home Falls can cause injuries. They can happen to people of all ages. There are many things you can do to make your home safe and to help prevent falls. What can I do on the outside of my home?  Regularly fix the edges of walkways and driveways and fix any cracks.  Remove anything that might make you trip as you walk through a door, such as a raised step or threshold.  Trim any bushes or trees on the path to your home.  Use bright outdoor lighting.  Clear any walking paths of anything that might make someone trip, such as rocks or tools.  Regularly check to see if handrails are loose or broken. Make sure that both sides of any steps have handrails.  Any raised decks and porches should have guardrails on the edges.  Have any leaves, snow, or ice cleared regularly.  Use sand or salt on walking paths during winter.  Clean up any spills in your garage right away. This includes oil or grease spills. What can I do in the bathroom?  Use night lights.  Install grab bars by the toilet and in the tub and shower. Do not use towel bars as grab bars.  Use non-skid mats or decals in the tub or shower.  If you need to sit down in the shower, use a plastic, non-slip stool.  Keep the floor dry. Clean up any water that spills on the floor as soon as it happens.  Remove  soap buildup in the tub or shower regularly.  Attach bath mats securely with double-sided non-slip rug tape.  Do not have throw rugs and other things on the floor that can make you trip. What can I do in the bedroom?  Use night lights.  Make sure that you have a light by your bed that is easy to reach.  Do not use any sheets or blankets that are too big for your bed. They should not hang down onto the floor.  Have a firm chair that has side arms. You can use this for support while you get dressed.  Do not have throw rugs and other things on the floor that can make you trip. What can I do in the kitchen?  Clean up any spills right away.  Avoid walking on wet floors.  Keep items that you use a lot in easy-to-reach places.  If you need to reach something above you, use a strong step stool that has a grab bar.  Keep electrical cords out of the way.  Do not use floor polish or wax that makes floors slippery. If you must use wax, use non-skid floor wax.  Do not have throw rugs and other things on the floor that can make you trip. What can I do with my stairs?  Do not leave any items on the stairs.  Make sure that there are handrails on both sides of the stairs and use them. Fix handrails that are broken or loose. Make sure that handrails are as long as the stairways.  Check any carpeting to make sure that it is firmly attached to the stairs. Fix any carpet that is loose or worn.  Avoid having throw rugs at the top or bottom of the stairs. If you do have throw rugs, attach them to the floor with carpet tape.  Make sure that you have a light switch at the top of the stairs and the bottom of the stairs. If you do not have them, ask someone to add them for you. What else can I do to help prevent falls?  Wear shoes that:  Do not have high heels.  Have rubber bottoms.  Are comfortable and fit you well.  Are closed at the toe. Do not wear sandals.  If you use a  stepladder:  Make sure that it is fully opened. Do not climb a closed stepladder.  Make sure that both sides of the stepladder are locked into place.  Ask someone to hold it for you, if possible.  Clearly mark and make sure that you can see:  Any grab bars or handrails.  First and last steps.  Where the edge of each step is.  Use tools that help you move around (mobility aids) if they are needed. These include:  Canes.  Walkers.  Scooters.  Crutches.  Turn on the lights when you go into a dark area. Replace any light bulbs as soon as they burn out.  Set up your furniture so you have a clear path. Avoid moving your furniture around.  If any of your floors are uneven, fix them.  If there are any pets around you, be aware of where they are.  Review your medicines with your doctor. Some medicines can make you feel dizzy. This can increase your chance of falling. Ask your doctor what other things that you can do to help prevent falls. This information is not intended to replace advice given to you by your health care provider. Make sure you discuss any questions you have with your health care provider. Document Released: 08/11/2009 Document Revised: 03/22/2016 Document Reviewed: 11/19/2014 Elsevier Interactive Patient Education  2017 [employer].

## 2018-03-11 NOTE — Patient Instructions (Signed)
Osteoporosis Osteoporosis happens when your bones become thinner and weaker. Weak bones can break (fracture) more easily when you slip or fall. Bones most at risk of breaking are in the hip, wrist, and spine. Follow these instructions at home:  Get enough calcium and vitamin D. These nutrients are good for your bones.  Exercise as told by your doctor.  Do not use any tobacco products. This includes cigarettes, chewing tobacco, and electronic cigarettes. If you need help quitting, ask your doctor.  Limit the amount of alcohol you drink.  Take medicines only as told by your doctor.  Keep all follow-up visits as told by your doctor. This is important.  Take care at home to prevent falls. Some ways to do this are: ? Keep rooms well lit and tidy. ? Put safety rails on your stairs. ? Put a rubber mat in the bathroom and other places that are often wet or slippery. Get help right away if:  You fall.  You hurt yourself. This information is not intended to replace advice given to you by your health care provider. Make sure you discuss any questions you have with your health care provider. Document Released: 01/07/2012 Document Revised: 03/22/2016 Document Reviewed: 03/25/2014 Elsevier Interactive Patient Education  2018 [employer]  

## 2018-03-11 NOTE — Progress Notes (Signed)
Subjective:   Natalie Lucas is a 84 y.o. female who presents for Medicare Annual (Subsequent) preventive examination.  Review of Systems:  N/A  Cardiac Risk Factors include: advanced age (>7men, >70 women);dyslipidemia;hypertension     Objective:     Vitals: BP 128/66 (BP Location: Right Arm)   Pulse 66   Temp 98.1 F (36.7 C) (Oral)   Ht 5\' 5"  (1.651 m)   Wt 180 lb (81.6 kg)   BMI 29.95 kg/m   Body mass index is 29.95 kg/m.  Advanced Directives 03/11/2018  Does Patient Have a Medical Advance Directive? Yes  Type of Paramedic of [place];Living will  Copy of [place] in Chart? No - copy requested    Tobacco Social History   Tobacco Use  Smoking Status Former Smoker  Smokeless Tobacco Never Used  Tobacco Comment   social smoker - quit 60+ years ago     Counseling given: Not Answered Comment: social smoker - quit 60+ years ago   Clinical Intake:  Pre-visit preparation completed: Yes  Pain : No/denies pain Pain Score: 0-No pain     Nutritional Status: BMI 25 -29 Overweight Nutritional Risks: None Diabetes: No  How often do you need to have someone help you when you read instructions, pamphlets, or other written materials from your doctor or pharmacy?: 1 - Never  Interpreter Needed?: No  Information entered by :: [facility], LPN  Past Medical History:  Diagnosis Date  . Hyperlipidemia   . Hypertension    Past Surgical History:  Procedure Laterality Date  . BREAST SURGERY Right 08/2002   Ductal Hyperplasia  . COSMETIC SURGERY  1993  . JOINT REPLACEMENT Left    Left knee replacement.  Marland Kitchen KNEE SURGERY Left 2002   Family History  Problem Relation Age of Onset  . Dementia Mother   . Transient ischemic attack Mother   . Lung cancer Father   . Cancer Paternal Uncle   . Cancer Paternal Grandfather   . Melanoma Cousin    Social History   Socioeconomic History  . Marital status: Widowed    Spouse  name: Not on file  . Number of children: 2  . Years of education: College  . Highest education level: Bachelor's degree (e.g., BA, AB, BS)  Occupational History  . Occupation: Retired  Scientific laboratory technician  . Financial resource strain: Not hard at all  . Food insecurity:    Worry: Never true    Inability: Never true  . Transportation needs:    Medical: No    Non-medical: No  Tobacco Use  . Smoking status: Former Research scientist (life sciences)  . Smokeless tobacco: Never Used  . Tobacco comment: social smoker - quit 60+ years ago  Substance and Sexual Activity  . Alcohol use: No  . Drug use: No  . Sexual activity: Not on file  Lifestyle  . Physical activity:    Days per week: Not on file    Minutes per session: Not on file  . Stress: Not at all  Relationships  . Social connections:    Talks on phone: Not on file    Gets together: Not on file    Attends religious service: Not on file    Active member of club or organization: Not on file    Attends meetings of clubs or organizations: Not on file    Relationship status: Not on file  Other Topics Concern  . Not on file  Social History Narrative  .  Not on file    Outpatient Encounter Medications as of 03/11/2018  Medication Sig  . calcium carbonate (OS-CAL) 600 MG TABS tablet Take 1,200 mg by mouth daily with breakfast.   . hydrochlorothiazide (MICROZIDE) 12.5 MG capsule TAKE ONE CAPSULE BY MOUTH DAILY  . metoprolol tartrate (LOPRESSOR) 50 MG tablet TAKE ONE TABLET BY MOUTH TWICE DAILY.  . MULTIPLE VITAMIN PO Take by mouth daily.   . rosuvastatin (CRESTOR) 5 MG tablet TAKE ONE TABLET BY MOUTH EVERY DAY   No facility-administered encounter medications on file as of 03/11/2018.     Activities of Daily Living In your present state of health, do you have any difficulty performing the following activities: 03/11/2018  Hearing? Y  Comment Pt has some hearing loss but not major. Pt declines hearing aids at this time.   Vision? N  Difficulty concentrating or  making decisions? N  Walking or climbing stairs? Y  Comment Due to knee and back pain.   Dressing or bathing? N  Doing errands, shopping? Y  Comment Does not drive at night or out of town.  Preparing Food and eating ? N  Using the Toilet? N  In the past six months, have you accidently leaked urine? Y  Comment Occsaionally, unable to wear protection.  Do you have problems with loss of bowel control? N  Managing your Medications? N  Managing your Finances? N  Housekeeping or managing your Housekeeping? N  Some recent data might be hidden    Patient Care Team: Paulene Floor as PCP - General (Physician Assistant) Isaias Cowman, MD as Consulting Physician (Cardiology)    Assessment:   This is a routine wellness examination for [name].  Exercise Activities and Dietary recommendations Current Exercise Habits: The patient does not participate in regular exercise at present, Exercise limited by: orthopedic condition(s)  Goals    . DIET - REDUCE PORTION SIZE     Recommend to cut portions in half and to eat 3 small meals a day with two healthy snacks in between.       Fall Risk Fall Risk  03/11/2018 07/18/2016 07/12/2015  Falls in the past year? No No Yes  Number falls in past yr: - - 1  Injury with Fall? - - No   Is the patient's home free of loose throw rugs in walkways, pet beds, electrical cords, etc?   yes      Grab bars in the bathroom? yes      Handrails on the stairs?   yes      Adequate lighting?   yes  Timed Get Up and Go performed: N/A  Depression Screen PHQ 2/9 Scores 03/11/2018 07/18/2016 07/12/2015  PHQ - 2 Score 0 0 0     Cognitive Function: Pt declined screening today.        Immunization History  Administered Date(s) Administered  . Influenza Split 09/16/2009, 09/05/2011  . Influenza, High Dose Seasonal PF 07/12/2015, 07/18/2016, 07/26/2017  . Pneumococcal Conjugate-13 07/12/2015  . Pneumococcal Polysaccharide-23 08/30/1998    Qualifies  for Shingles Vaccine? Due for Shingles vaccine. Declined my offer to administer today. Education has been provided regarding the importance of this vaccine. Pt has been advised to call her insurance company to determine her out of pocket expense. Advised she may also receive this vaccine at her local pharmacy or Health Dept. Verbalized acceptance and understanding.  Screening Tests Health Maintenance  Topic Date Due  . TETANUS/TDAP  12/03/1945  . INFLUENZA VACCINE  05/29/2018  .  DEXA SCAN  Completed  . PNA vac Low Risk Adult  Completed    Cancer Screenings: Lung: Low Dose CT Chest recommended if Age 31-80 years, 30 pack-year currently smoking OR have quit w/in 15years. Patient does not qualify. Breast:  Up to date on Mammogram? Yes   Up to date of Bone Density/Dexa? Yes Colorectal: Up to date  Additional Screenings:  Hepatitis C Screening: N/A     Plan:  I have personally reviewed and addressed the Medicare Annual Wellness questionnaire and have noted the following in the patient's chart:  A. Medical and social history B. Use of alcohol, tobacco or illicit drugs  C. Current medications and supplements D. Functional ability and status E.  Nutritional status F.  Physical activity G. Advance directives H. List of other physicians I.  Hospitalizations, surgeries, and ER visits in previous 12 months J.  [place] such as hearing and vision if needed, cognitive and depression L. Referrals and appointments - none  In addition, I have reviewed and discussed with patient certain preventive protocols, quality metrics, and best practice recommendations. A written personalized care plan for preventive services as well as general preventive health recommendations were provided to patient.  See attached scanned questionnaire for additional information.   Signed,  Fabio Neighbors, LPN Nurse Health Advisor   Nurse Recommendations: Pt declined the tetanus vaccine today.

## 2018-03-11 NOTE — Progress Notes (Signed)
Patient: Natalie Lucas, Female    DOB: 09-Mar-1927, 84 y.o.   MRN: [id number] Visit Date: 03/14/2018  Today's Provider: Trinna Post, PA-C   Chief Complaint  Patient presents with  . Medicare Wellness   Subjective:     Complete Physical Natalie Lucas is a 84 y.o. female. She feels well. She reports exercising some. She reports she is sleeping well.Grew up [place], [place]. Graduation went well with her granddaughter in [place]. Granddaughter is relocating to [place]. Patient continues to keep up with friends and church groups.   She has a history of prediabetes. She had a DEXA on 07/26/2015 which showed osteoporosis. She does not recall being treated for this.   Patient has history of atrial flutter and HTN. Currently on metoprolol and Lopressor with good results. Also on 5 mg crestor daily, tolerates this well. Denies chest pain, leg swelling.   She also has a history of anemia.  -----------------------------------------------------------   Review of Systems   12 point ROS was reviewed and is negative except for HPI.   Social History   Socioeconomic History  . Marital status: Widowed    Spouse name: Not on file  . Number of children: 2  . Years of education: College  . Highest education level: Bachelor's degree (e.g., BA, AB, BS)  Occupational History  . Occupation: Retired  Scientific laboratory technician  . Financial resource strain: Not hard at all  . Food insecurity:    Worry: Never true    Inability: Never true  . Transportation needs:    Medical: No    Non-medical: No  Tobacco Use  . Smoking status: Former Research scientist (life sciences)  . Smokeless tobacco: Never Used  . Tobacco comment: social smoker - quit 60+ years ago  Substance and Sexual Activity  . Alcohol use: No  . Drug use: No  . Sexual activity: Not on file  Lifestyle  . Physical activity:    Days per week: Not on file    Minutes per session: Not on file  . Stress: Not at all  Relationships  . Social connections:   Talks on phone: Not on file    Gets together: Not on file    Attends religious service: Not on file    Active member of club or organization: Not on file    Attends meetings of clubs or organizations: Not on file    Relationship status: Not on file  . Intimate partner violence:    Fear of current or ex partner: Not on file    Emotionally abused: Not on file    Physically abused: Not on file    Forced sexual activity: Not on file  Other Topics Concern  . Not on file  Social History Narrative  . Not on file    Past Medical History:  Diagnosis Date  . Hyperlipidemia   . Hypertension      Patient Active Problem List   Diagnosis Date Noted  . Prediabetes 03/11/2018  . Degeneration of intervertebral disc at C4-C5 level 06/21/2017  . Absolute anemia 07/12/2015  . Atrial flutter ([place]) 07/12/2015  . Bleeding ulcer 07/12/2015  . Acid reflux 07/12/2015  . H/O: pneumonia 07/12/2015  . History of urinary anomaly 07/12/2015  . Blood glucose elevated 07/12/2015  . BP (high blood pressure) 07/12/2015  . Scoliosis 07/12/2015  . Arthritis, degenerative 07/12/2015  . OP (osteoporosis) 07/12/2015  . Cervical pain 07/12/2015  . Avitaminosis D 07/12/2015  . Microcytic anemia 07/12/2015  .  Hyperlipemia 05/30/2015  . Breathlessness on exertion 06/01/2014    Past Surgical History:  Procedure Laterality Date  . BREAST SURGERY Right 08/2002   Ductal Hyperplasia  . COSMETIC SURGERY  1993  . JOINT REPLACEMENT Left    Left knee replacement.  Marland Kitchen KNEE SURGERY Left 2002    Her family history includes Cancer in her paternal grandfather and paternal uncle; Dementia in her mother; Lung cancer in her father; Melanoma in her cousin; Transient ischemic attack in her mother.      Current Outpatient Medications:  .  calcium carbonate (OS-CAL) 600 MG TABS tablet, Take 1,200 mg by mouth daily with breakfast. , Disp: , Rfl:  .  hydrochlorothiazide (MICROZIDE) 12.5 MG capsule, TAKE ONE CAPSULE BY MOUTH  DAILY, Disp: 90 capsule, Rfl: 1 .  metoprolol tartrate (LOPRESSOR) 50 MG tablet, TAKE ONE TABLET BY MOUTH TWICE DAILY., Disp: 90 tablet, Rfl: 2 .  MULTIPLE VITAMIN PO, Take by mouth daily. , Disp: , Rfl:  .  rosuvastatin (CRESTOR) 5 MG tablet, TAKE ONE TABLET BY MOUTH EVERY DAY, Disp: 90 tablet, Rfl: 1  Patient Care Team: Paulene Floor as PCP - General (Physician Assistant) Isaias Cowman, MD as Consulting Physician (Cardiology)     Objective:   Vitals: BP 128/66 (BP Location: Right Arm)   Pulse 66   Temp 98.1 F (36.7 C) (Oral)   Ht 5\' 5"  (1.651 m)   Wt 180 lb (81.6 kg)   BMI 29.95 kg/m   Physical Exam  Activities of Daily Living In your present state of health, do you have any difficulty performing the following activities: 03/11/2018  Hearing? Y  Comment Pt has some hearing loss but not major. Pt declines hearing aids at this time.   Vision? N  Difficulty concentrating or making decisions? N  Walking or climbing stairs? Y  Comment Due to knee and back pain.   Dressing or bathing? N  Doing errands, shopping? Y  Comment Does not drive at night or out of town.  Preparing Food and eating ? N  Using the Toilet? N  In the past six months, have you accidently leaked urine? Y  Comment Occsaionally, unable to wear protection.  Do you have problems with loss of bowel control? N  Managing your Medications? N  Managing your Finances? N  Housekeeping or managing your Housekeeping? N  Some recent data might be hidden    Fall Risk Assessment Fall Risk  03/11/2018 07/18/2016 07/12/2015  Falls in the past year? No No Yes  Number falls in past yr: - - 1  Injury with Fall? - - No     Depression Screen PHQ 2/9 Scores 03/11/2018 07/18/2016 07/12/2015  PHQ - 2 Score 0 0 0      Assessment & Plan:    Annual Physical Reviewed patient's Family Medical History Reviewed and updated list of patient's medical providers Assessment of cognitive impairment was  done Assessed patient's functional ability Established a written schedule for health screening [place] Completed and Reviewed  Exercise Activities and Dietary recommendations Goals    . DIET - REDUCE PORTION SIZE     Recommend to cut portions in half and to eat 3 small meals a day with two healthy snacks in between.       Immunization History  Administered Date(s) Administered  . Influenza Split 09/16/2009, 09/05/2011  . Influenza, High Dose Seasonal PF 07/12/2015, 07/18/2016, 07/26/2017  . Pneumococcal Conjugate-13 07/12/2015  . Pneumococcal Polysaccharide-23 08/30/1998  Health Maintenance  Topic Date Due  . TETANUS/TDAP  [birth year]1947  . INFLUENZA VACCINE  05/29/2018  . DEXA SCAN  Completed  . PNA vac Low Risk Adult  Completed     Discussed health benefits of physical activity, and encouraged her to engage in regular exercise appropriate for her age and condition.    1. Annual physical exam   2. Essential hypertension  Controlled today.   - Comprehensive Metabolic Panel (CMET) - Lipid Profile  3. Osteoporosis, unspecified osteoporosis type, unspecified pathological fracture presence  Will start Fosamax 70 mg once weekly and follow up in one month. Patient reports ability to remain upright for 30 min.   - DG BONE DENSITY (DXA); Future  4. Microcytic anemia  - CBC with Differential  5. Prediabetes  - HgB A1c  Return in about 1 month (around 04/11/2018) for osteoporosis .  The entirety of the information documented in the History of Present Illness, Review of Systems and Physical Exam were personally obtained by me. Portions of this information were initially documented by Ashley Royalty, CMA and reviewed by me for thoroughness and accuracy.   ------------------------------------------------------------------------------------------------------------    Trinna Post, PA-C  [facility]

## 2018-03-12 LAB — COMPREHENSIVE METABOLIC PANEL
ALT: 15 IU/L (ref 0–32)
AST: 17 IU/L (ref 0–40)
Albumin/Globulin Ratio: 1.3 (ref 1.2–2.2)
Albumin: 3.7 g/dL (ref 3.5–4.7)
Alkaline Phosphatase: 88 IU/L (ref 39–117)
BUN/Creatinine Ratio: 23 (ref 12–28)
BUN: 19 mg/dL (ref 8–27)
Bilirubin Total: 0.3 mg/dL (ref 0.0–1.2)
CO2: 25 mmol/L (ref 20–29)
Calcium: 9.7 mg/dL (ref 8.7–10.3)
Chloride: 98 mmol/L (ref 96–106)
Creatinine, Ser: 0.84 mg/dL (ref 0.57–1.00)
GFR calc Af Amer: 71 mL/min/{1.73_m2} (ref >59)
GFR calc non Af Amer: 62 mL/min/{1.73_m2} (ref >59)
Globulin, Total: 2.8 g/dL (ref 1.5–4.5)
Glucose: 86 mg/dL (ref 65–99)
Potassium: 4 mmol/L (ref 3.5–5.2)
Sodium: 138 mmol/L (ref 134–144)
Total Protein: 6.5 g/dL (ref 6.0–8.5)

## 2018-03-12 LAB — HEMOGLOBIN A1C
Est. average glucose Bld gHb Est-mCnc: 126 mg/dL
Hgb A1c MFr Bld: 6 % — ABNORMAL HIGH (ref 4.8–5.6)

## 2018-03-12 LAB — CBC WITH DIFFERENTIAL/PLATELET
Basophils Absolute: 0 10*3/uL (ref 0.0–0.2)
Basos: 1 %
EOS (ABSOLUTE): 0.1 10*3/uL (ref 0.0–0.4)
Eos: 2 %
Hematocrit: 39.4 % (ref 34.0–46.6)
Hemoglobin: 12.8 g/dL (ref 11.1–15.9)
Immature Grans (Abs): 0 10*3/uL (ref 0.0–0.1)
Immature Granulocytes: 0 %
Lymphocytes Absolute: 2.2 10*3/uL (ref 0.7–3.1)
Lymphs: 39 %
MCH: 29.6 pg (ref 26.6–33.0)
MCHC: 32.5 g/dL (ref 31.5–35.7)
MCV: 91 fL (ref 79–97)
Monocytes Absolute: 0.4 10*3/uL (ref 0.1–0.9)
Monocytes: 8 %
Neutrophils Absolute: 2.8 10*3/uL (ref 1.4–7.0)
Neutrophils: 50 %
Platelets: 230 10*3/uL (ref 150–379)
RBC: 4.33 x10E6/uL (ref 3.77–5.28)
RDW: 13.2 % (ref 12.3–15.4)
WBC: 5.5 10*3/uL (ref 3.4–10.8)

## 2018-03-12 LAB — LIPID PANEL
Chol/HDL Ratio: 2.8 ratio (ref 0.0–4.4)
Cholesterol, Total: 171 mg/dL (ref 100–199)
HDL: 62 mg/dL (ref >39)
LDL Calculated: 75 mg/dL (ref 0–99)
Triglycerides: 171 mg/dL — ABNORMAL HIGH (ref 0–149)
VLDL Cholesterol Cal: 34 mg/dL (ref 5–40)

## 2018-03-13 ENCOUNTER — Telehealth: Payer: Self-pay

## 2018-03-13 NOTE — Telephone Encounter (Signed)
Left message advised pt.  (Per DPR.)  Thanks,   -Mickel Baas

## 2018-03-13 NOTE — Telephone Encounter (Signed)
-----   Message from Trinna Post, [place] sent at 03/12/2018  4:55 PM EDT ----- Labs stable.

## 2018-03-14 MED ORDER — ALENDRONATE SODIUM 70 MG PO TABS: 70.0000 mg | ORAL_TABLET | ORAL | 2 refills | Status: DC

## 2018-03-20 ENCOUNTER — Other Ambulatory Visit: Payer: Self-pay | Admitting: Physician Assistant

## 2018-03-20 ENCOUNTER — Other Ambulatory Visit: Payer: Self-pay | Admitting: *Deleted

## 2018-03-20 DIAGNOSIS — E785 Hyperlipidemia, unspecified: Secondary | ICD-10-CM

## 2018-03-20 NOTE — Telephone Encounter (Signed)
THis is for PCP

## 2018-03-25 DIAGNOSIS — I4892 Unspecified atrial flutter: Secondary | ICD-10-CM | POA: Diagnosis not present

## 2018-03-25 DIAGNOSIS — E785 Hyperlipidemia, unspecified: Secondary | ICD-10-CM | POA: Diagnosis not present

## 2018-03-25 DIAGNOSIS — I10 Essential (primary) hypertension: Secondary | ICD-10-CM | POA: Diagnosis not present

## 2018-03-25 DIAGNOSIS — R0602 Shortness of breath: Secondary | ICD-10-CM | POA: Diagnosis not present

## 2018-03-31 DIAGNOSIS — L578 Other skin changes due to chronic exposure to nonionizing radiation: Secondary | ICD-10-CM | POA: Diagnosis not present

## 2018-03-31 DIAGNOSIS — L820 Inflamed seborrheic keratosis: Secondary | ICD-10-CM | POA: Diagnosis not present

## 2018-03-31 DIAGNOSIS — L821 Other seborrheic keratosis: Secondary | ICD-10-CM | POA: Diagnosis not present

## 2018-03-31 DIAGNOSIS — C44619 Basal cell carcinoma of skin of left upper limb, including shoulder: Secondary | ICD-10-CM | POA: Diagnosis not present

## 2018-03-31 DIAGNOSIS — L570 Actinic keratosis: Secondary | ICD-10-CM | POA: Diagnosis not present

## 2018-03-31 DIAGNOSIS — D485 Neoplasm of uncertain behavior of skin: Secondary | ICD-10-CM | POA: Diagnosis not present

## 2018-03-31 DIAGNOSIS — Z85828 Personal history of other malignant neoplasm of skin: Secondary | ICD-10-CM | POA: Diagnosis not present

## 2018-03-31 DIAGNOSIS — D1800 Hemangioma unspecified site: Secondary | ICD-10-CM | POA: Diagnosis not present

## 2018-03-31 DIAGNOSIS — L308 Other specified dermatitis: Secondary | ICD-10-CM | POA: Diagnosis not present

## 2018-03-31 DIAGNOSIS — Z1283 Encounter for screening for malignant neoplasm of skin: Secondary | ICD-10-CM | POA: Diagnosis not present

## 2018-04-09 ENCOUNTER — Encounter: Payer: Self-pay | Admitting: Physician Assistant

## 2018-04-09 ENCOUNTER — Ambulatory Visit (INDEPENDENT_AMBULATORY_CARE_PROVIDER_SITE_OTHER): Payer: Medicare HMO | Admitting: Physician Assistant

## 2018-04-09 VITALS — BP 122/86 | HR 84 | Temp 97.5°F | Resp 16 | Wt 171.0 lb

## 2018-04-09 DIAGNOSIS — L8940 Pressure ulcer of contiguous site of back, buttock and hip, unspecified stage: Secondary | ICD-10-CM | POA: Diagnosis not present

## 2018-04-09 DIAGNOSIS — M810 Age-related osteoporosis without current pathological fracture: Secondary | ICD-10-CM

## 2018-04-09 NOTE — Progress Notes (Signed)
Patient: Natalie Lucas Female    DOB: [birth year]   84 y.o.   MRN: [id number] Visit Date: 04/10/2018  Today's Provider: Trinna Post, PA-C   Chief Complaint  Patient presents with  . Osteoporosis  . Rash    Left side.    Subjective:   Patient is 84 y/o woman. Mostly sedentary. Rash  This is a new problem. The current episode started in the past 7 days. The problem has been gradually worsening since onset. The affected locations include the left buttock. The rash is characterized by blistering, pain and redness. She was exposed to nothing. Past treatments include nothing.   Osteoporosis - 07/26/2015 DEXA indicated osteoporosis. Patient was started on Fosamax 70 mg once weekly. She is tolerating it without issue. Says her sister in law mentioned side effects and she does not want to take it anymore due to risk of esophageal erosions and osteonecrosis.    Allergies  Allergen Reactions  . Ace Inhibitors Cough  . Aspirin     Bleeding ulcers.  . Salicylates     Other reaction(s): Unknown     Current Outpatient Medications:  .  alendronate (FOSAMAX) 70 MG tablet, Take 1 tablet (70 mg total) by mouth every 7 (seven) days. Take with a full glass of water on an empty stomach., Disp: 4 tablet, Rfl: 2 .  calcium carbonate (OS-CAL) 600 MG TABS tablet, Take 1,200 mg by mouth daily with breakfast. , Disp: , Rfl:  .  hydrochlorothiazide (MICROZIDE) 12.5 MG capsule, TAKE ONE CAPSULE BY MOUTH DAILY, Disp: 90 capsule, Rfl: 1 .  metoprolol tartrate (LOPRESSOR) 50 MG tablet, TAKE ONE TABLET BY MOUTH TWICE DAILY., Disp: 90 tablet, Rfl: 2 .  MULTIPLE VITAMIN PO, Take by mouth daily. , Disp: , Rfl:  .  rosuvastatin (CRESTOR) 5 MG tablet, TAKE 1 TABLET DAILY, Disp: 90 tablet, Rfl: 1  Review of Systems  Constitutional: Negative.   Skin: Positive for rash. Negative for color change, pallor and wound.    Social History   Tobacco Use  . Smoking status: Former Research scientist (life sciences)  . Smokeless tobacco:  Never Used  . Tobacco comment: social smoker - quit 60+ years ago  Substance Use Topics  . Alcohol use: No   Objective:   BP 122/86 (BP Location: Left Arm, Patient Position: Sitting, Cuff Size: Large)   Pulse 84   Temp (!) 97.5 F (36.4 C) (Oral)   Resp 16   Wt 171 lb (77.6 kg)   BMI 28.46 kg/m  Vitals:   04/09/18 1413  BP: 122/86  Pulse: 84  Resp: 16  Temp: (!) 97.5 F (36.4 C)  TempSrc: Oral  Weight: 171 lb (77.6 kg)     Physical Exam  Constitutional: She is oriented to person, place, and time.  Cardiovascular: Normal rate and regular rhythm.  Pulmonary/Chest: Effort normal and breath sounds normal.  Neurological: She is alert and oriented to person, place, and time.  Skin: Skin is warm and dry. There is erythema.  There is an area of erythema overlying her left ischial tuberosity. There is a blister that contains serosanguinous fluid. It is tender and painful. Please see image below.  Psychiatric: She has a normal mood and affect. Her behavior is normal.      Media Information         Document Information   Photos  Left buttock  04/09/2018 14:30  Attached To:  Office Visit on 04/09/18 with Trinna Post, PA-C  Source Information   Trinna Post, PA-C  [facility]           Assessment & Plan:     1. Pressure injury of skin of contiguous region involving left buttock and hip, unspecified injury stage  Looks like pressure ulcer, possibly stage 2.  Patient reports she is mostly sedentary. Needs to be evaluated by wound for treatment and proper dressings. Counseled on importance of offloading pressure. Taking short, frequent standing breaks.   - AMB referral to wound care center  2. Osteoporosis, unspecified osteoporosis type, unspecified pathological fracture presence  Chronic issue, she wants to discontinue Fosamax. We have gone over the risks of doing this including progressive osteoporosis and significant life impairing or  fatal fractures. She understands the risks and wishes to discontinue Fosamax. Offered her endocrinology referral to explore other treatment options but she declines.  Return if symptoms worsen or fail to improve.  The entirety of the information documented in the History of Present Illness, Review of Systems and Physical Exam were personally obtained by me. Portions of this information were initially documented by Ashley Royalty, CMA and reviewed by me for thoroughness and accuracy.           Trinna Post, PA-C  [facility]

## 2018-04-09 NOTE — Patient Instructions (Signed)
Pressure Injury A pressure injury, sometimes called a bedsore, is an injury to the skin and underlying tissue caused by pressure. Pressure on blood vessels causes decreased blood flow to the skin, which can eventually cause the skin tissue to die and break down into a wound. Pressure injuries usually occur:  Over bony parts of the body such as the tailbone, shoulders, elbows, hips, and heels.  Under medical devices such as respiratory equipment, stockings, tubes, and splints.  Pressure injuries start as reddened areas on the skin and can lead to pain, muscle damage, and infection. Pressure injuries can vary in severity. What are the causes? Pressure injuries are caused by a lack of blood supply to an area of skin. They can occur from intense pressure over a short period of time or from less intense pressure over a long period of time. What increases the risk? This condition is more likely to develop in people who:  Are in the hospital or an extended care facility.  Are bedridden or in a wheelchair.  Have an injury or disease that keeps them from: ? Moving normally. ? Feeling pain or pressure.  Have a condition that: ? Makes them sleepy or less alert. ? Causes poor blood flow.  Need to wear a medical device.  Have poor control of their bladder or bowel functions (incontinence).  Have poor nutrition (malnutrition).  Are of certain ethnicities. People of African American and Latino or Hispanic descent are at higher risk compared to other ethnic groups.  If you are at risk for pressure ulcers, your health care provider may recommend certain types of bedding to help prevent them. These may include foam or gel mattresses covered with one of the following:  A sheepskin blanket.  A pad that is filled with gel, air, water, or foam.  What are the signs or symptoms? The main symptom is a blister or change in skin color that opens into a wound. Other symptoms include:  Red or dark  areas of skin that do not turn white or pale when pressed with a finger.  Pain, warmth, or change of skin texture.  How is this diagnosed? This condition is diagnosed with a medical history and physical exam. You may also have tests, including:  Blood tests to check for infection or signs of poor nutrition.  Imaging studies to check for damage to the deep tissues under your skin.  Blood flow studies.  Your pressure injury will be staged to determine its severity. Staging is an assessment of:  The depth of the pressure injury.  Which tissues are exposed because of the pressure injury.  The causes of the pressure injury.  How is this treated? The main focus of treatment is to help your injury heal. This may be done by:  Relieving or redistributing pressure on your skin. This includes: ? Frequently changing your position. ? Eliminating or minimizing positions that caused the wound or that can make the wound worse. ? Using specific bed mattresses and chair cushions. ? Refitting, resizing, or replacing any medical devices, or padding the skin under them. ? Using creams or powders to prevent rubbing (friction) on the skin.  Keeping your skin clean and dry. This may include using a skin cleanser or skin protectant as told by your health care provider. This may be a lotion, ointment, or spray.  Cleaning your injury and removing any dead tissue from the wound (debridement).  Placing a bandage (dressing) over your injury.  Preventing or treating infection.  This may include antibiotic, antimicrobial, or antiseptic medicines.  Treatment may also include medicine for pain. Sometimes surgery is needed to close the wound with a flap of healthy skin or a piece of skin from another area of your body (graft). You may need surgery if other treatments are not working or if your injury is very deep. Follow these instructions at home: Wound care  Follow instructions from your health care  provider about: ? How to take care of your wound. ? When and how you should change your dressing. ? When you should remove your dressing. If your dressing is dry and stuck when you try to remove it, moisten or wet the dressing with saline or water so that it can be removed without harming your skin or wound tissue.  Check your wound every day for signs of infection. Have a caregiver do this for you if you are not able. Watch for: ? More redness, swelling, or pain. ? More fluid, blood, or pus. ? A bad smell. Skin Care  Keep your skin clean and dry. Gently pat your skin dry.  Do not rub or massage your skin.  Use a skin protectant only as told by your health care provider.  Check your skin every day for any changes in color or any new blisters or sores (ulcers). Have a caregiver do this for you if you are not able. Medicines  Take over-the-counter and prescription medicines only as told by your health care provider.  If you were prescribed an antibiotic medicine, take it or apply it as told by your health care provider. Do not stop taking or using the antibiotic even if your condition improves. Reducing and Redistributing Pressure  Do not lie or sit in one position for a long time. Move or change position every two hours or as told by your health care provider.  Use pillows or cushions to reduce pressure. Ask your health care provider to recommend cushions or pads for you.  Use medical devices that do not rub your skin. Tell your health care provider if one of your medical devices is causing a pressure injury to develop. General instructions   Eat a healthy diet that includes lots of protein. Ask your health care provider for diet advice.  Drink enough fluid to keep your urine clear or pale yellow.  Be as active as you can every day. Ask your health care provider to suggest safe exercises or activities.  Do not abuse drugs or alcohol.  Keep all follow-up visits as told by your  health care provider. This is important.  Do not smoke. Contact a health care provider if:   You have chills or fever.  Your pain medicine is not helping.  You have any changes in skin color.  You have new blisters or sores.  You develop warmth, redness, or swelling near a pressure injury.  You have a bad odor or pus coming from your pressure injury.  You lose control of your bowels or bladder.  You develop new symptoms.  Your wound does not improve after 1-2 weeks of treatment.  You develop a new medical condition, such as diabetes, peripheral vascular disease, or conditions that affect your defense (immune) system. This information is not intended to replace advice given to you by your health care provider. Make sure you discuss any questions you have with your health care provider. Document Released: 10/15/2005 Document Revised: 03/19/2016 Document Reviewed: 02/23/2015 [employer] Interactive Patient Education  2018 [employer]  

## 2018-04-16 ENCOUNTER — Encounter: Payer: Medicare HMO | Attending: Internal Medicine | Admitting: Internal Medicine

## 2018-04-16 DIAGNOSIS — I4892 Unspecified atrial flutter: Secondary | ICD-10-CM | POA: Diagnosis not present

## 2018-04-16 DIAGNOSIS — I10 Essential (primary) hypertension: Secondary | ICD-10-CM | POA: Insufficient documentation

## 2018-04-16 DIAGNOSIS — L89322 Pressure ulcer of left buttock, stage 2: Secondary | ICD-10-CM | POA: Diagnosis not present

## 2018-04-16 DIAGNOSIS — M419 Scoliosis, unspecified: Secondary | ICD-10-CM | POA: Diagnosis not present

## 2018-04-16 DIAGNOSIS — M810 Age-related osteoporosis without current pathological fracture: Secondary | ICD-10-CM | POA: Insufficient documentation

## 2018-04-16 DIAGNOSIS — D649 Anemia, unspecified: Secondary | ICD-10-CM | POA: Diagnosis not present

## 2018-04-18 NOTE — Progress Notes (Signed)
Natalie Lucas ([id number]) Visit Report for 04/16/2018 Debridement Details Patient Name: Natalie Lucas Date of Service: 04/16/2018 9:45 AM Medical Record Number: [id number] Patient Account Number: [id number] Date of Birth/Sex: 09-09-1927 (84 y.o. Female) Treating RN: Cornell Barman Primary Care Provider: Carles Collet Other Clinician: Referring Provider: Carles Collet Treating Provider/Extender: Tito Dine in Treatment: 0 Debridement Performed for Wound #1 Left,Distal Gluteus Assessment: Performed By: Physician Ricard Dillon, MD Debridement Type: Chemical/Enzymatic/Mechanical Agent Used: saline Pre-procedure Verification/Time Yes - 10:36 Out Taken: Start Time: 10:36 Pain Control: Other : lidocaine 4% Instrument: Other : gauze Bleeding: None End Time: 10:38 Procedural Pain: 1 Post Procedural Pain: 0 Response to Treatment: Procedure was tolerated well Level of Consciousness: Awake and Alert Post Debridement Measurements of Total Wound Length: (cm) 3 Stage: Category/Stage II Width: (cm) 1.5 Depth: (cm) 0.1 Volume: (cm) 0.353 Character of Wound/Ulcer Post Stable Debridement: Post Procedure Diagnosis Same as Pre-procedure Electronic Signature(s) Signed: 04/16/2018 4:07:49 PM By: Linton Ham MD Signed: 04/16/2018 4:19:21 PM By: Gretta Cool, BSN, RN, CWS, Kim RN, BSN Previous Signature: 04/16/2018 10:41:41 AM Version By: Gretta Cool, BSN, RN, CWS, Kim RN, BSN Entered By: Linton Ham on 04/16/2018 12:09:07 Natalie Lucas ([id number]) -------------------------------------------------------------------------------- HPI Details Patient Name: Natalie Lucas Date of Service: 04/16/2018 9:45 AM Medical Record Number: [id number] Patient Account Number: [id number] Date of Birth/Sex: 21-May-1927 (84 y.o. Female) Treating RN: Cornell Barman Primary Care Provider: Carles Collet Other Clinician: Referring Provider: Carles Collet Treating Provider/Extender: Tito Dine in Treatment: 0 History of Present Illness HPI Description: ADMISSION 04/16/18; This is an otherwise independent 84 year old woman who saw her primary doctor on 04/09/18 when she became aware of the uncomfortable feeling in her lower left buttock. The history was that she had been using a "Garden scooter" and working in her yard for several straight days. Her primary doctor noted a blister with pain and redness. She lives alone and has not been able to specifically address this. The blister is fully unroofed and is a stage II pressure injury. She had a comprehensive metabolic panel which was normal including a normal serum albumin on 03/11/18. Patient has a history of hypertension, atrial flutter, osteoporosis. she tells me she has a scoliosis in her spine which keeps her shifted over to her left when she is sitting which probably explains why this happened in the left side and nothing is present on the right. Electronic Signature(s) Signed: 04/16/2018 4:07:49 PM By: Linton Ham MD Entered By: Linton Ham on 04/16/2018 12:12:10 Natalie Lucas ([id number]) -------------------------------------------------------------------------------- Physical Exam Details Patient Name: Natalie Lucas Date of Service: 04/16/2018 9:45 AM Medical Record Number: [id number] Patient Account Number: [id number] Date of Birth/Sex: [birth year] (84 y.o. Female) Treating RN: Cornell Barman Primary Care Provider: Carles Collet Other Clinician: Referring Provider: Carles Collet Treating Provider/Extender: Tito Dine in Treatment: 0 Constitutional Patient is hypertensive.. Pulse regular and within target range for patient.Marland Kitchen Respirations regular, non-labored and within target range.. Temperature is normal and within the target range for the patient.Marland Kitchen appears in no distress. Eyes Conjunctivae clear. No discharge. Respiratory Respiratory effort is easy and symmetric bilaterally. Rate is normal  at rest and on room air.. Bilateral breath sounds are clear and equal in all lobes with no wheezes, rales or rhonchi.. Cardiovascular Heart rhythm and rate regular, without murmur or gallop.. Gastrointestinal (GI) soft nontender. Integumentary (Hair, Skin) no primary skin issues seen. Psychiatric No evidence of depression, anxiety, or agitation. Calm, cooperative, and communicative. Appropriate interactions and affect.. Notes  wound exam; the areas on the lower left buttock close to the midline. Fairly large clearly blistered area with the superficial layer of the blister already opened. The wound was vigorously washed with saline. Healthy-looking surface. No evidence of surrounding inf Electronic Signature(s) Signed: 04/16/2018 4:07:49 PM By: Linton Ham MD Entered By: Linton Ham on 04/16/2018 12:14:22 Natalie Lucas ([id number]) -------------------------------------------------------------------------------- Physician Orders Details Patient Name: Natalie Lucas Date of Service: 04/16/2018 9:45 AM Medical Record Number: [id number] Patient Account Number: [id number] Date of Birth/Sex: [birth year] (84 y.o. Female) Treating RN: Cornell Barman Primary Care Provider: Carles Collet Other Clinician: Referring Provider: Carles Collet Treating Provider/Extender: Tito Dine in Treatment: 0 Verbal / Phone Orders: No Diagnosis Coding Wound Cleansing Wound #1 Left,Distal Gluteus o Clean wound with Normal Saline. o May Shower, gently pat wound dry prior to applying new dressing. Anesthetic (add to Medication List) Wound #1 Left,Distal Gluteus o Topical Lidocaine 4% cream applied to wound bed prior to debridement (In Clinic Only). Primary Wound Dressing Wound #1 Left,Distal Gluteus o Silver Alginate Secondary Dressing Wound #1 Left,Distal Gluteus o Boardered Foam Dressing Dressing Change Frequency Wound #1 Left,Distal Gluteus o Change dressing every other  day. Follow-up Appointments Wound #1 Left,Distal Gluteus o Return Appointment in 1 week. Off-Loading Wound #1 Left,Distal Gluteus o Turn and reposition every 2 hours o Other: - Keep pressure off of area Electronic Signature(s) Signed: 04/16/2018 4:07:49 PM By: Linton Ham MD Signed: 04/16/2018 4:19:21 PM By: Gretta Cool, BSN, RN, CWS, Kim RN, BSN Entered By: Gretta Cool, BSN, RN, CWS, Kim on 04/16/2018 10:37:17 Natalie, Lucas ([id number]) -------------------------------------------------------------------------------- Problem List Details Patient Name: Natalie Lucas Date of Service: 04/16/2018 9:45 AM Medical Record Number: [id number] Patient Account Number: [id number] Date of Birth/Sex: [birth year] (84 y.o. Female) Treating RN: Cornell Barman Primary Care Provider: Carles Collet Other Clinician: Referring Provider: Carles Collet Treating Provider/Extender: Tito Dine in Treatment: 0 Active Problems ICD-10 Impacting Encounter Code Description Active Date Wound Healing Diagnosis L89.322 Pressure ulcer of left buttock, stage 2 04/16/2018 No Yes Inactive Problems Resolved Problems Electronic Signature(s) Signed: 04/16/2018 4:07:49 PM By: Linton Ham MD Entered By: Linton Ham on 04/16/2018 12:03:08 Natalie Lucas ([id number]) -------------------------------------------------------------------------------- Progress Note Details Patient Name: Natalie Lucas Date of Service: 04/16/2018 9:45 AM Medical Record Number: [id number] Patient Account Number: [id number] Date of Birth/Sex: Sep 24, 1927 (84 y.o. Female) Treating RN: Cornell Barman Primary Care Provider: Carles Collet Other Clinician: Referring Provider: Carles Collet Treating Provider/Extender: Ricard Dillon Weeks in Treatment: 0 Subjective History of Present Illness (HPI) ADMISSION 04/16/18; This is an otherwise independent 84 year old woman who saw her primary doctor on 04/09/18 when she became aware of  the uncomfortable feeling in her lower left buttock. The history was that she had been using a "Garden scooter" and working in her yard for several straight days. Her primary doctor noted a blister with pain and redness. She lives alone and has not been able to specifically address this. The blister is fully unroofed and is a stage II pressure injury. She had a comprehensive metabolic panel which was normal including a normal serum albumin on 03/11/18. Patient has a history of hypertension, atrial flutter, osteoporosis. she tells me she has a scoliosis in her spine which keeps her shifted over to her left when she is sitting which probably explains why this happened in the left side and nothing is present on the right. Wound History Patient presents with 1 open wound that has been present for approximately 1 week. Patient  has been treating wound in the following manner: none. Laboratory tests have not been performed in the last month. Patient reportedly has not tested positive for an antibiotic resistant organism. Patient reportedly has not tested positive for osteomyelitis. Patient reportedly has not had testing performed to evaluate circulation in the legs. Patient History Allergies No Known Allergies Family History Cancer - Father, Hypertension - Father, Lung Disease - Father, Stroke - Mother, Thyroid Problems - Mother, No family history of Diabetes, Heart Disease, Kidney Disease, Seizures, Tuberculosis. Social History Former smoker - 60 years quit, Marital Status - Widowed, Alcohol Use - Never, Drug Use - No History, Caffeine Use - Daily. Medical History Eyes Patient has history of Cataracts - surgery bilateral Ear/Nose/Mouth/Throat Denies history of Chronic sinus problems/congestion, Middle ear problems Hematologic/Lymphatic Patient has history of Anemia Denies history of Hemophilia, Human Immunodeficiency Virus, Lymphedema, Sickle Cell Disease Respiratory Denies history of  Aspiration, Asthma, Chronic Obstructive Pulmonary Disease (COPD), Pneumothorax, Sleep Apnea, Tuberculosis Cardiovascular Patient has history of Arrhythmia - atrial flutter, Hypertension Denies history of Angina, Congestive Heart Failure, Coronary Artery Disease, Deep Vein Thrombosis, Hypotension, Natalie Lucas, Natalie T. ([id number]) Myocardial Infarction, Peripheral Arterial Disease, Peripheral Venous Disease, Phlebitis, Vasculitis Gastrointestinal Denies history of Cirrhosis , Colitis, Crohn s, Hepatitis A, Hepatitis B, Hepatitis C Endocrine Denies history of Type I Diabetes, Type II Diabetes Genitourinary Denies history of End Stage Renal Disease Immunological Denies history of Lupus Erythematosus, Raynaud s, Scleroderma Integumentary (Skin) Denies history of History of Burn, History of pressure wounds Musculoskeletal Patient has history of Osteoarthritis Denies history of Gout, Rheumatoid Arthritis, Osteomyelitis Neurologic Denies history of Dementia, Neuropathy, Quadriplegia, Paraplegia, Seizure Disorder Medical And Surgical History Notes Musculoskeletal scoliosis Review of Systems (ROS) Constitutional Symptoms (General Health) Denies complaints or symptoms of Fatigue, Fever, Chills, Marked Weight Change. Eyes Complains or has symptoms of Glasses / Contacts - glasses. Denies complaints or symptoms of Dry Eyes, Vision Changes. Ear/Nose/Mouth/Throat Denies complaints or symptoms of Difficult clearing ears, Sinusitis. Hematologic/Lymphatic Denies complaints or symptoms of Bleeding / Clotting Disorders, Human Immunodeficiency Virus. Respiratory Complains or has symptoms of Shortness of Breath - with exertion. Denies complaints or symptoms of Chronic or frequent coughs. Cardiovascular Complains or has symptoms of LE edema. Denies complaints or symptoms of Chest pain. Gastrointestinal Denies complaints or symptoms of Frequent diarrhea, Nausea, Vomiting. Endocrine Denies complaints or  symptoms of Hepatitis, Thyroid disease, Polydypsia (Excessive Thirst). Genitourinary Denies complaints or symptoms of Kidney failure/ Dialysis, chronic UTI Immunological Denies complaints or symptoms of Hives, Itching. Integumentary (Skin) Complains or has symptoms of Wounds, Bleeding or bruising tendency. Denies complaints or symptoms of Breakdown, Swelling. Musculoskeletal Complains or has symptoms of Muscle Weakness - egs bilateral. Neurologic Denies complaints or symptoms of Numbness/parasthesias, Focal/Weakness. Oncologic The patient has no complaints or symptoms. Natalie, LEPP T. ([id number]) Objective Constitutional Patient is hypertensive.. Pulse regular and within target range for patient.Marland Kitchen Respirations regular, non-labored and within target range.. Temperature is normal and within the target range for the patient.Marland Kitchen appears in no distress. Vitals Time Taken: 10:00 AM, Height: 65 in, Source: Stated, Weight: 179 lbs, Source: Measured, BMI: 29.8, Temperature: 98.4 F, Pulse: 72 bpm, Respiratory Rate: 18 breaths/min, Blood Pressure: 162/71 mmHg. Eyes Conjunctivae clear. No discharge. Respiratory Respiratory effort is easy and symmetric bilaterally. Rate is normal at rest and on room air.. Bilateral breath sounds are clear and equal in all lobes with no wheezes, rales or rhonchi.. Cardiovascular Heart rhythm and rate regular, without murmur or gallop.. Gastrointestinal (GI) soft nontender. Psychiatric No evidence of  depression, anxiety, or agitation. Calm, cooperative, and communicative. Appropriate interactions and affect.. General Notes: wound exam; the areas on the lower left buttock close to the midline. Fairly large clearly blistered area with the superficial layer of the blister already opened. The wound was vigorously washed with saline. Healthy-looking surface. No evidence of surrounding inf Integumentary (Hair, Skin) no primary skin issues seen. Wound #1 status is  Open. Original cause of wound was Blister. The wound is located on the Left,Distal Gluteus. The wound measures 3cm length x 1.5cm width x 0.1cm depth; 3.534cm^2 area and 0.353cm^3 volume. There is no tunneling or undermining noted. There is a medium amount of serous drainage noted. The wound margin is distinct with the outline attached to the wound base. There is large (67-100%) red granulation within the wound bed. There is a small (1-33%) amount of necrotic tissue within the wound bed including Eschar. The periwound skin appearance exhibited: Dry/Scaly. The periwound skin appearance did not exhibit: Callus, Crepitus, Excoriation, Induration, Rash, Scarring, Maceration, Atrophie Blanche, Cyanosis, Ecchymosis, Hemosiderin Staining, Mottled, Pallor, Rubor, Erythema. Periwound temperature was noted as No Abnormality. Assessment Active Problems ICD-10 Pressure ulcer of left buttock, stage 2 Natalie Lucas, Natalie T. ([id number]) Procedures Wound #1 Pre-procedure diagnosis of Wound #1 is a Pressure Ulcer located on the Left,Distal Gluteus . There was a Chemical/Enzymatic/Mechanical debridement performed by Ricard Dillon, MD. With the following instrument(s): gauze after achieving pain control using Other (lidocaine 4%). Other agent used was saline. A time out was conducted at 10:36, prior to the start of the procedure. There was no bleeding. The procedure was tolerated well with a pain level of 1 throughout and a pain level of 0 following the procedure. Patient s Level of Consciousness post procedure was recorded as Awake and Alert. Post Debridement Measurements: 3cm length x 1.5cm width x 0.1cm depth; 0.353cm^3 volume. Post debridement Stage noted as Category/Stage II. Character of Wound/Ulcer Post Debridement is stable. Post procedure Diagnosis Wound #1: Same as Pre-Procedure Plan Wound Cleansing: Wound #1 Left,Distal Gluteus: Clean wound with Normal Saline. May Shower, gently pat wound dry prior  to applying new dressing. Anesthetic (add to Medication List): Wound #1 Left,Distal Gluteus: Topical Lidocaine 4% cream applied to wound bed prior to debridement (In Clinic Only). Primary Wound Dressing: Wound #1 Left,Distal Gluteus: Silver Alginate Secondary Dressing: Wound #1 Left,Distal Gluteus: Boardered Foam Dressing Dressing Change Frequency: Wound #1 Left,Distal Gluteus: Change dressing every other day. Follow-up Appointments: Wound #1 Left,Distal Gluteus: Return Appointment in 1 week. Off-Loading: Wound #1 Left,Distal Gluteus: Turn and reposition every 2 hours Other: - Keep pressure off of area #1 the patient has a superficial stage II wound on the left buttock which was clearly a blister. I was able to look at pictures in Epic to this effect currently no evidence of infection. #2 the patient lives alone not an easy area for this woman to dress however I think if we were to put calcium alginate on border foam she can put this on the wound area and change this every second day I am not worried about this closing #3 we talked about pressure-relief in this area. Patient expressed understanding ADALYNA, GODBEE ([id number]) Electronic Signature(s) Signed: 04/16/2018 4:07:49 PM By: Linton Ham MD Entered By: Linton Ham on 04/16/2018 12:17:24 Natalie Lucas ([id number]) -------------------------------------------------------------------------------- ROS/PFSH Details Patient Name: Natalie Lucas Date of Service: 04/16/2018 9:45 AM Medical Record Number: [id number] Patient Account Number: [id number] Date of Birth/Sex: 21-Jun-1927 (84 y.o. Female) Treating RN: Roger Shelter Primary Care Provider:  Carles Collet Other Clinician: Referring Provider: Carles Collet Treating Provider/Extender: Tito Dine in Treatment: 0 Wound History Do you currently have one or more open woundso Yes How many open wounds do you currently haveo 1 Approximately how long have  you had your woundso 1 week How have you been treating your wound(s) until nowo none Has your wound(s) ever healed and then re-openedo No Have you had any lab work done in the past montho No Have you tested positive for an antibiotic resistant organism (MRSA, VRE)o No Have you tested positive for osteomyelitis (bone infection)o No Have you had any tests for circulation on your legso No Constitutional Symptoms (General Health) Complaints and Symptoms: Negative for: Fatigue; Fever; Chills; Marked Weight Change Eyes Complaints and Symptoms: Positive for: Glasses / Contacts - glasses Negative for: Dry Eyes; Vision Changes Medical History: Positive for: Cataracts - surgery bilateral Ear/Nose/Mouth/Throat Complaints and Symptoms: Negative for: Difficult clearing ears; Sinusitis Medical History: Negative for: Chronic sinus problems/congestion; Middle ear problems Hematologic/Lymphatic Complaints and Symptoms: Negative for: Bleeding / Clotting Disorders; Human Immunodeficiency Virus Medical History: Positive for: Anemia Negative for: Hemophilia; Human Immunodeficiency Virus; Lymphedema; Sickle Cell Disease Respiratory Complaints and Symptoms: Positive for: Shortness of Breath - with exertion Negative for: Chronic or frequent coughs Medical History: Negative for: Aspiration; Asthma; Chronic Obstructive Pulmonary Disease (COPD); Pneumothorax; Sleep Apnea; Natalie Lucas, Natalie T. ([id number]) Tuberculosis Cardiovascular Complaints and Symptoms: Positive for: LE edema Negative for: Chest pain Medical History: Positive for: Arrhythmia - atrial flutter; Hypertension Negative for: Angina; Congestive Heart Failure; Coronary Artery Disease; Deep Vein Thrombosis; Hypotension; Myocardial Infarction; Peripheral Arterial Disease; Peripheral Venous Disease; Phlebitis; Vasculitis Gastrointestinal Complaints and Symptoms: Negative for: Frequent diarrhea; Nausea; Vomiting Medical History: Negative for:  Cirrhosis ; Colitis; Crohnos; Hepatitis A; Hepatitis B; Hepatitis C Endocrine Complaints and Symptoms: Negative for: Hepatitis; Thyroid disease; Polydypsia (Excessive Thirst) Medical History: Negative for: Type I Diabetes; Type II Diabetes Genitourinary Complaints and Symptoms: Negative for: Kidney failure/ Dialysis Review of System Notes: chronic UTI Medical History: Negative for: End Stage Renal Disease Immunological Complaints and Symptoms: Negative for: Hives; Itching Medical History: Negative for: Lupus Erythematosus; Raynaudos; Scleroderma Integumentary (Skin) Complaints and Symptoms: Positive for: Wounds; Bleeding or bruising tendency Negative for: Breakdown; Swelling Medical History: Negative for: History of Burn; History of pressure wounds Musculoskeletal Complaints and Symptoms: Positive for: Muscle Weakness - egs bilateral Natalie Lucas, Natalie T. ([id number]) Medical History: Positive for: Osteoarthritis Negative for: Gout; Rheumatoid Arthritis; Osteomyelitis Past Medical History Notes: scoliosis Neurologic Complaints and Symptoms: Negative for: Numbness/parasthesias; Focal/Weakness Medical History: Negative for: Dementia; Neuropathy; Quadriplegia; Paraplegia; Seizure Disorder Oncologic Complaints and Symptoms: No Complaints or Symptoms HBO Extended History Items Eyes: Cataracts Immunizations Pneumococcal Vaccine: Received Pneumococcal Vaccination: Yes Implantable Devices Family and Social History Cancer: Yes - Father; Diabetes: No; Heart Disease: No; Hypertension: Yes - Father; Kidney Disease: No; Lung Disease: Yes - Father; Seizures: No; Stroke: Yes - Mother; Thyroid Problems: Yes - Mother; Tuberculosis: No; Former smoker - 77 years quit; Marital Status - Widowed; Alcohol Use: Never; Drug Use: No History; Caffeine Use: Daily; Financial Concerns: No; Food, Clothing or Shelter Needs: No; Support System Lacking: No; Transportation Concerns: No; Advanced  Directives: Yes; Do not resuscitate: No; Living Will: Yes; Medical Power of Attorney: Yes Electronic Signature(s) Signed: 04/16/2018 4:07:49 PM By: Linton Ham MD Signed: 04/16/2018 4:10:19 PM By: Roger Shelter Entered By: Roger Shelter on 04/16/2018 10:10:22 Natalie Lucas ([id number]) -------------------------------------------------------------------------------- SuperBill Details Patient Name: Natalie Lucas Date of Service: 04/16/2018 Medical Record Number: [id number]  Patient Account Number: [id number] Date of Birth/Sex: 06/23/1927 (84 y.o. Female) Treating RN: Cornell Barman Primary Care Provider: Carles Collet Other Clinician: Referring Provider: Carles Collet Treating Provider/Extender: Ricard Dillon Weeks in Treatment: 0 Diagnosis Coding ICD-10 Codes Code Description [phone] Pressure ulcer of left buttock, stage 2 Facility Procedures CPT4 Code: [id number] Description: 84665 - WOUND CARE VISIT-LEV 4 EST PT Modifier: Quantity: 1 Physician Procedures CPT4 Code: [id number] Description: WC PHYS LEVEL 3 o NEW PT ICD-10 Diagnosis Description X79.390 Pressure ulcer of left buttock, stage 2 Modifier: Quantity: 1 Electronic Signature(s) Signed: 04/16/2018 4:07:49 PM By: Linton Ham MD Previous Signature: 04/16/2018 10:42:07 AM Version By: Gretta Cool, BSN, RN, CWS, Kim RN, BSN Entered By: Linton Ham on 04/16/2018 12:18:39

## 2018-04-18 NOTE — Progress Notes (Signed)
Natalie Lucas, Natalie Lucas ([id number]) Visit Report for 04/16/2018 Allergy List Details Patient Name: Natalie Lucas, Natalie Lucas Date of Service: 04/16/2018 9:45 AM Medical Record Number: [id number] Patient Account Number: [id number] Date of Birth/Sex: [birth year] (84 y.o. Female) Treating RN: Roger Shelter Primary Care Herman Mell: Carles Collet Other Clinician: Referring Leiani Enright: Carles Collet Treating Purva Vessell/Extender: Ricard Dillon Weeks in Treatment: 0 Allergies Active Allergies No Known Allergies Allergy Notes Electronic Signature(s) Signed: 04/16/2018 4:10:19 PM By: Roger Shelter Entered By: Roger Shelter on 04/16/2018 10:01:01 Natalie Lucas ([id number]) -------------------------------------------------------------------------------- Arrival Information Details Patient Name: Natalie Lucas Date of Service: 04/16/2018 9:45 AM Medical Record Number: [id number] Patient Account Number: [id number] Date of Birth/Sex: 28-Aug-1927 (84 y.o. Female) Treating RN: Roger Shelter Primary Care Jamesyn Moorefield: Carles Collet Other Clinician: Referring Angle Dirusso: Carles Collet Treating Demetris Meinhardt/Extender: Tito Dine in Treatment: 0 Visit Information Patient Arrived: Cane Arrival Time: 09:59 Accompanied By: self Transfer Assistance: None Patient Identification Verified: Yes Secondary Verification Process Completed: Yes Electronic Signature(s) Signed: 04/16/2018 4:10:19 PM By: Roger Shelter Entered By: Roger Shelter on 04/16/2018 09:59:32 Deyoung, Loyal Jacobson ([id number]) -------------------------------------------------------------------------------- Clinic Level of Care Assessment Details Patient Name: Natalie Lucas Date of Service: 04/16/2018 9:45 AM Medical Record Number: [id number] Patient Account Number: [id number] Date of Birth/Sex: 1927-04-22 (84 y.o. Female) Treating RN: Cornell Barman Primary Care Berkley Wrightsman: Carles Collet Other Clinician: Referring Rodrick Payson: Carles Collet Treating Sherral Dirocco/Extender: Tito Dine in Treatment: 0 Clinic Level of Care Assessment Items TOOL 2 Quantity Score []  - Use when only an EandM is performed on the INITIAL visit 0 ASSESSMENTS - Nursing Assessment / Reassessment X - General Physical Exam (combine w/ comprehensive assessment (listed just below) when 1 20 performed on new pt. evals) X- 1 25 Comprehensive Assessment (HX, ROS, Risk Assessments, Wounds Hx, etc.) ASSESSMENTS - Wound and Skin Assessment / Reassessment X - Simple Wound Assessment / Reassessment - one wound 1 5 []  - 0 Complex Wound Assessment / Reassessment - multiple wounds []  - 0 Dermatologic / Skin Assessment (not related to wound area) ASSESSMENTS - Ostomy and/or Continence Assessment and Care []  - Incontinence Assessment and Management 0 []  - 0 Ostomy Care Assessment and Management (repouching, etc.) PROCESS - Coordination of Care X - Simple Patient / Family Education for ongoing care 1 15 []  - 0 Complex (extensive) Patient / Family Education for ongoing care X- 1 10 Staff obtains Programmer, systems, Records, Test Results / Process Orders []  - 0 Staff telephones HHA, Nursing Homes / Clarify orders / etc []  - 0 Routine Transfer to another Facility (non-emergent condition) []  - 0 Routine Hospital Admission (non-emergent condition) X- 1 15 New Admissions / Biomedical engineer / Ordering NPWT, Apligraf, etc. []  - 0 Emergency Hospital Admission (emergent condition) X- 1 10 Simple Discharge Coordination []  - 0 Complex (extensive) Discharge Coordination PROCESS - Special Needs []  - Pediatric / Minor Patient Management 0 []  - 0 Isolation Patient Management Natalie Lucas, Natalie Lucas T. ([id number]) []  - 0 Hearing / Language / Visual special needs []  - 0 Assessment of Community assistance (transportation, D/C planning, etc.) []  - 0 Additional assistance / Altered mentation []  - 0 Support Surface(s) Assessment (bed, cushion, seat,  etc.) INTERVENTIONS - Wound Cleansing / Measurement X - Wound Imaging (photographs - any number of wounds) 1 5 []  - 0 Wound Tracing (instead of photographs) X- 1 5 Simple Wound Measurement - one wound []  - 0 Complex Wound Measurement - multiple wounds X- 1 5 Simple Wound Cleansing - one wound []  - 0 Complex Wound  Cleansing - multiple wounds INTERVENTIONS - Wound Dressings []  - Small Wound Dressing one or multiple wounds 0 X- 1 15 Medium Wound Dressing one or multiple wounds []  - 0 Large Wound Dressing one or multiple wounds []  - 0 Application of Medications - injection INTERVENTIONS - Miscellaneous []  - External ear exam 0 []  - 0 Specimen Collection (cultures, biopsies, blood, body fluids, etc.) []  - 0 Specimen(s) / Culture(s) sent or taken to Lab for analysis []  - 0 Patient Transfer (multiple staff / Harrel Lemon Lift / Similar devices) []  - 0 Simple Staple / Suture removal (25 or less) []  - 0 Complex Staple / Suture removal (26 or more) []  - 0 Hypo / Hyperglycemic Management (close monitor of Blood Glucose) []  - 0 Ankle / Brachial Index (ABI) - do not check if billed separately Has the patient been seen at the hospital within the last three years: Yes Total Score: 130 Level Of Care: New/Established - Level 4 Electronic Signature(s) Signed: 04/16/2018 4:19:21 PM By: Gretta Cool, BSN, RN, CWS, Kim RN, BSN Entered By: Gretta Cool, BSN, RN, CWS, Kim on 04/16/2018 10:35:29 Natalie Lucas ([id number]) -------------------------------------------------------------------------------- Encounter Discharge Information Details Patient Name: Natalie Lucas Date of Service: 04/16/2018 9:45 AM Medical Record Number: [id number] Patient Account Number: [id number] Date of Birth/Sex: [birth year] (84 y.o. Female) Treating RN: Montey Hora Primary Care Anelly Samarin: Carles Collet Other Clinician: Referring Ambert Virrueta: Carles Collet Treating Cherlynn Popiel/Extender: Tito Dine in Treatment:  0 Encounter Discharge Information Items Discharge Condition: Stable Ambulatory Status: Cane Discharge Destination: Home Transportation: Private Auto Accompanied By: self Schedule Follow-up Appointment: Yes Clinical Summary of Care: Electronic Signature(s) Signed: 04/16/2018 10:59:11 AM By: Montey Hora Entered By: Montey Hora on 04/16/2018 10:59:11 Natalie Lucas ([id number]) -------------------------------------------------------------------------------- Lower Extremity Assessment Details Patient Name: Natalie Lucas Date of Service: 04/16/2018 9:45 AM Medical Record Number: [id number] Patient Account Number: [id number] Date of Birth/Sex: [birth year] (84 y.o. Female) Treating RN: Roger Shelter Primary Care Massey Ruhland: Carles Collet Other Clinician: Referring Nadav Swindell: Carles Collet Treating Shanecia Hoganson/Extender: Ricard Dillon Weeks in Treatment: 0 Electronic Signature(s) Signed: 04/16/2018 4:10:19 PM By: Roger Shelter Entered By: Roger Shelter on 04/16/2018 10:25:50 Nelligan, Loyal Jacobson ([id number]) -------------------------------------------------------------------------------- Multi Wound Chart Details Patient Name: Natalie Lucas Date of Service: 04/16/2018 9:45 AM Medical Record Number: [id number] Patient Account Number: [id number] Date of Birth/Sex: 1927/05/05 (84 y.o. Female) Treating RN: Cornell Barman Primary Care Ryliegh Mcduffey: Carles Collet Other Clinician: Referring Vernice Mannina: Carles Collet Treating Kaedon Fanelli/Extender: Tito Dine in Treatment: 0 Vital Signs Height(in): 65 Pulse(bpm): 72 Weight(lbs): 179 Blood Pressure(mmHg): 162/71 Body Mass Index(BMI): 30 Temperature(F): 98.4 Respiratory Rate 18 (breaths/min): Photos: [1:No Photos] [N/A:N/A] Wound Location: [1:Left Gluteus - Distal] [N/A:N/A] Wounding Event: [1:Blister] [N/A:N/A] Primary Etiology: [1:Pressure Ulcer] [N/A:N/A] Comorbid History: [1:Cataracts, Anemia, Arrhythmia, Hypertension,  Osteoarthritis] [N/A:N/A] Date Acquired: [1:04/11/2018] [N/A:N/A] Weeks of Treatment: [1:0] [N/A:N/A] Wound Status: [1:Open] [N/A:N/A] Measurements L x W x D [1:3x1.5x0.1] [N/A:N/A] (cm) Area (cm) : [1:3.534] [N/A:N/A] Volume (cm) : [1:0.353] [N/A:N/A] Classification: [1:Category/Stage II] [N/A:N/A] Exudate Amount: [1:Medium] [N/A:N/A] Exudate Type: [1:Serous] [N/A:N/A] Exudate Color: [1:amber] [N/A:N/A] Wound Margin: [1:Distinct, outline attached N/A] Granulation Amount: [1:Large (67-100%)] [N/A:N/A] Granulation Quality: [1:Red] [N/A:N/A] Necrotic Amount: [1:Small (1-33%)] [N/A:N/A] Necrotic Tissue: [1:Eschar] [N/A:N/A] Exposed Structures: [1:Fascia: No Fat Layer (Subcutaneous Tissue) Exposed: No Tendon: No Muscle: No Joint: No Bone: No] [N/A:N/A] Epithelialization: [1:Small (1-33%)] [N/A:N/A] Debridement: [1:Chemical/Enzymatic/Mechanical] [N/A:N/A] Pre-procedure [1:10:36] [N/A:N/A] Verification/Time Out Taken: Pain Control: [1:Other] [N/A:N/A] Instrument: [1:Other(gauze)] [N/A:N/A] Bleeding: [1:None] [N/A:N/A] Procedural Pain: 1 N/A N/A Post Procedural Pain: 0 N/A  N/A Debridement Treatment Procedure was tolerated well N/A N/A Response: Post Debridement 3x1.5x0.1 N/A N/A Measurements L x W x D (cm) Post Debridement Volume: 0.353 N/A N/A (cm) Post Debridement Stage: Category/Stage II N/A N/A Periwound Skin Texture: Excoriation: No N/A N/A Induration: No Callus: No Crepitus: No Rash: No Scarring: No Periwound Skin Moisture: Dry/Scaly: Yes N/A N/A Maceration: No Periwound Skin Color: Atrophie Blanche: No N/A N/A Cyanosis: No Ecchymosis: No Erythema: No Hemosiderin Staining: No Mottled: No Pallor: No Rubor: No Temperature: No Abnormality N/A N/A Tenderness on Palpation: No N/A N/A Wound Preparation: Ulcer Cleansing: N/A N/A Rinsed/Irrigated with Saline Topical Anesthetic Applied: Other: lidocaine 4% Procedures Performed: Debridement N/A N/A Treatment  Notes Wound #1 (Left, Distal Gluteus) 1. Cleansed with: Clean wound with Normal Saline 2. Anesthetic Topical Lidocaine 4% cream to wound bed prior to debridement 4. Dressing Applied: Calcium Alginate with Silver 5. Secondary Dressing Applied Bordered Foam Dressing Electronic Signature(s) Signed: 04/16/2018 4:07:49 PM By: Linton Ham MD Entered By: Linton Ham on 04/16/2018 12:04:45 Natalie Lucas ([id number]) -------------------------------------------------------------------------------- Multi-Disciplinary Care Plan Details Patient Name: Natalie Lucas Date of Service: 04/16/2018 9:45 AM Medical Record Number: [id number] Patient Account Number: [id number] Date of Birth/Sex: 06-16-1927 (84 y.o. Female) Treating RN: Cornell Barman Primary Care Bearett Porcaro: Carles Collet Other Clinician: Referring Leary Mcnulty: Carles Collet Treating Judea Riches/Extender: Tito Dine in Treatment: 0 Active Inactive ` Orientation to the Wound Care Program Nursing Diagnoses: Knowledge deficit related to the wound healing center program Goals: Patient/caregiver will verbalize understanding of the [facility] Program Date Initiated: 04/16/2018 Target Resolution Date: 04/30/2018 Goal Status: Active Interventions: Provide education on orientation to the wound center Notes: ` Pressure Nursing Diagnoses: Knowledge deficit related to management of pressures ulcers Potential for impaired tissue integrity related to pressure, friction, moisture, and shear Goals: Patient/caregiver will verbalize risk factors for pressure ulcer development Date Initiated: 04/16/2018 Target Resolution Date: 04/30/2018 Goal Status: Active Interventions: Provide education on pressure ulcers Notes: ` Wound/Skin Impairment Nursing Diagnoses: Impaired tissue integrity Goals: Ulcer/skin breakdown will have a volume reduction of 80% by week 12 Date Initiated: 04/16/2018 Target Resolution Date:  06/16/2018 Goal Status: Active MARKEETA, Natalie Lucas ([id number]) Interventions: Assess ulceration(s) every visit Treatment Activities: Topical wound management initiated : 04/16/2018 Notes: Electronic Signature(s) Signed: 04/16/2018 4:19:21 PM By: Gretta Cool, BSN, RN, CWS, Kim RN, BSN Entered By: Gretta Cool, BSN, RN, CWS, Kim on 04/16/2018 10:32:28 Natalie Lucas ([id number]) -------------------------------------------------------------------------------- Pain Assessment Details Patient Name: Natalie Lucas Date of Service: 04/16/2018 9:45 AM Medical Record Number: [id number] Patient Account Number: [id number] Date of Birth/Sex: 1927-05-19 (84 y.o. Female) Treating RN: Roger Shelter Primary Care Andersson Larrabee: Carles Collet Other Clinician: Referring Zacariah Belue: Carles Collet Treating Maryetta Shafer/Extender: Tito Dine in Treatment: 0 Active Problems Location of Pain Severity and Description of Pain Patient Has Paino Yes Site Locations Rate the pain. Current Pain Level: 2 Pain Management and Medication Current Pain Management: Electronic Signature(s) Signed: 04/16/2018 4:10:19 PM By: Roger Shelter Entered By: Roger Shelter on 04/16/2018 10:00:02 Natalie Lucas ([id number]) -------------------------------------------------------------------------------- Patient/Caregiver Education Details Patient Name: Natalie Lucas Date of Service: 04/16/2018 9:45 AM Medical Record Number: [id number] Patient Account Number: [id number] Date of Birth/Gender: [birth year] (84 y.o. Female) Treating RN: Montey Hora Primary Care Physician: Carles Collet Other Clinician: Referring Physician: Carles Collet Treating Physician/Extender: Tito Dine in Treatment: 0 Education Assessment Education Provided To: Patient Education Topics Provided Wound/Skin Impairment: Handouts: Other: wound care as ordered Methods: Demonstration, Explain/Verbal Responses: State content  correctly Electronic Signature(s) Signed:  04/16/2018 3:47:23 PM By: Montey Hora Entered By: Montey Hora on 04/16/2018 11:00:14 Natalie Lucas ([id number]) -------------------------------------------------------------------------------- Wound Assessment Details Patient Name: Natalie Lucas Date of Service: 04/16/2018 9:45 AM Medical Record Number: [id number] Patient Account Number: [id number] Date of Birth/Sex: 09/20/1927 (84 y.o. Female) Treating RN: Roger Shelter Primary Care Brielyn Bosak: Carles Collet Other Clinician: Referring Celeste Tavenner: Carles Collet Treating Thai Burgueno/Extender: Tito Dine in Treatment: 0 Wound Status Wound Number: 1 Primary Pressure Ulcer Etiology: Wound Location: Left Gluteus - Distal Wound Status: Open Wounding Event: Blister Comorbid Cataracts, Anemia, Arrhythmia, Date Acquired: 04/11/2018 History: Hypertension, Osteoarthritis Weeks Of Treatment: 0 Clustered Wound: No Photos Photo Uploaded By: Roger Shelter on 04/16/2018 15:59:36 Wound Measurements Length: (cm) 3 Width: (cm) 1.5 Depth: (cm) 0.1 Area: (cm) 3.534 Volume: (cm) 0.353 % Reduction in Area: % Reduction in Volume: Epithelialization: Small (1-33%) Tunneling: No Undermining: No Wound Description Classification: Category/Stage II Wound Margin: Distinct, outline attached Exudate Amount: Medium Exudate Type: Serous Exudate Color: amber Foul Odor After Cleansing: No Slough/Fibrino Yes Wound Bed Granulation Amount: Large (67-100%) Exposed Structure Granulation Quality: Red Fascia Exposed: No Necrotic Amount: Small (1-33%) Fat Layer (Subcutaneous Tissue) Exposed: No Necrotic Quality: Eschar Tendon Exposed: No Muscle Exposed: No Joint Exposed: No Bone Exposed: No Periwound Skin Texture Stadler, Natalie T. ([id number]) Texture Color No Abnormalities Noted: No No Abnormalities Noted: No Callus: No Atrophie Blanche: No Crepitus: No Cyanosis: No Excoriation:  No Ecchymosis: No Induration: No Erythema: No Rash: No Hemosiderin Staining: No Scarring: No Mottled: No Pallor: No Moisture Rubor: No No Abnormalities Noted: No Dry / Scaly: Yes Temperature / Pain Maceration: No Temperature: No Abnormality Wound Preparation Ulcer Cleansing: Rinsed/Irrigated with Saline Topical Anesthetic Applied: Other: lidocaine 4%, Treatment Notes Wound #1 (Left, Distal Gluteus) 1. Cleansed with: Clean wound with Normal Saline 2. Anesthetic Topical Lidocaine 4% cream to wound bed prior to debridement 4. Dressing Applied: Calcium Alginate with Silver 5. Secondary Dressing Applied Bordered Foam Dressing Electronic Signature(s) Signed: 04/16/2018 4:10:19 PM By: Roger Shelter Entered By: Roger Shelter on 04/16/2018 10:25:40 Natalie Lucas ([id number]) -------------------------------------------------------------------------------- Vitals Details Patient Name: Natalie Lucas Date of Service: 04/16/2018 9:45 AM Medical Record Number: [id number] Patient Account Number: [id number] Date of Birth/Sex: 1927/02/21 (84 y.o. Female) Treating RN: Roger Shelter Primary Care Tekelia Kareem: Carles Collet Other Clinician: Referring Avner Stroder: Carles Collet Treating Honore Wipperfurth/Extender: Tito Dine in Treatment: 0 Vital Signs Time Taken: 10:00 Temperature (F): 98.4 Height (in): 65 Pulse (bpm): 72 Source: Stated Respiratory Rate (breaths/min): 18 Weight (lbs): 179 Blood Pressure (mmHg): 162/71 Source: Measured Reference Range: 80 - 120 mg / dl Body Mass Index (BMI): 29.8 Electronic Signature(s) Signed: 04/16/2018 4:10:19 PM By: Roger Shelter Entered By: Roger Shelter on 04/16/2018 10:00:38

## 2018-04-18 NOTE — Progress Notes (Signed)
Natalie Lucas, Natalie Lucas ([id number]) Visit Report for 04/16/2018 Abuse/Suicide Risk Screen Details Patient Name: Natalie Lucas, Natalie Lucas Date of Service: 04/16/2018 9:45 AM Medical Record Number: [id number] Patient Account Number: [id number] Date of Birth/Sex: October 19, 1927 (84 y.o. Female) Treating RN: Roger Shelter Primary Care Martin Belling: Carles Collet Other Clinician: Referring Faris Coolman: Carles Collet Treating Vester Balthazor/Extender: Tito Dine in Treatment: 0 Abuse/Suicide Risk Screen Items Answer ABUSE/SUICIDE RISK SCREEN: Has anyone close to you tried to hurt or harm you recentlyo No Do you feel uncomfortable with anyone in your familyo No Has anyone forced you do things that you didnot want to doo No Do you have any thoughts of harming yourselfo No Patient displays signs or symptoms of abuse and/or neglect. No Electronic Signature(s) Signed: 04/16/2018 4:10:19 PM By: Roger Shelter Entered By: Roger Shelter on 04/16/2018 10:10:39 Natalie Lucas ([id number]) -------------------------------------------------------------------------------- Activities of Daily Living Details Patient Name: Natalie Lucas Date of Service: 04/16/2018 9:45 AM Medical Record Number: [id number] Patient Account Number: [id number] Date of Birth/Sex: 1927/08/13 (84 y.o. Female) Treating RN: Roger Shelter Primary Care Anani Gu: Carles Collet Other Clinician: Referring Dniya Neuhaus: Carles Collet Treating Abdifatah Colquhoun/Extender: Tito Dine in Treatment: 0 Activities of Daily Living Items Answer Activities of Daily Living (Please select one for each item) Drive Automobile Completely Able Take Medications Completely Able Use Telephone Completely Able Care for Appearance Completely Able Use Toilet Completely Able Bath / Shower Completely Able Dress Self Completely Able Feed Self Completely Able Walk Completely Able Get In / Out Bed Completely Able Housework Completely Able Prepare Meals  Completely Able Handle Money Completely Able Shop for Self Completely Able Electronic Signature(s) Signed: 04/16/2018 4:10:19 PM By: Roger Shelter Entered By: Roger Shelter on 04/16/2018 10:11:11 Natalie Lucas ([id number]) -------------------------------------------------------------------------------- Education Assessment Details Patient Name: Natalie Lucas Date of Service: 04/16/2018 9:45 AM Medical Record Number: [id number] Patient Account Number: [id number] Date of Birth/Sex: March 31, 1927 (84 y.o. Female) Treating RN: Roger Shelter Primary Care Rylon Poitra: Carles Collet Other Clinician: Referring Naseem Varden: Carles Collet Treating Kristianne Albin/Extender: Tito Dine in Treatment: 0 Primary Learner Assessed: Patient Learning Preferences/Education Level/Primary Language Learning Preference: Explanation Highest Education Level: College or Above Preferred Language: English Cognitive Barrier Assessment/Beliefs Language Barrier: No Translator Needed: No Memory Deficit: No Emotional Barrier: No Cultural/Religious Beliefs Affecting Medical Care: No Physical Barrier Assessment Impaired Vision: Yes Glasses Impaired Hearing: No Decreased Hand dexterity: No Knowledge/Comprehension Assessment Knowledge Level: High Comprehension Level: High Ability to understand written High instructions: Ability to understand verbal High instructions: Motivation Assessment Anxiety Level: Calm Cooperation: Cooperative Education Importance: Acknowledges Need Interest in Health Problems: Asks Questions Perception: Coherent Willingness to Engage in Self- High Management Activities: Readiness to Engage in Self- High Management Activities: Electronic Signature(s) Signed: 04/16/2018 4:10:19 PM By: Roger Shelter Entered By: Roger Shelter on 04/16/2018 10:11:38 Natalie Lucas ([id number]) -------------------------------------------------------------------------------- Fall  Risk Assessment Details Patient Name: Natalie Lucas Date of Service: 04/16/2018 9:45 AM Medical Record Number: [id number] Patient Account Number: [id number] Date of Birth/Sex: Dec 01, 1926 (84 y.o. Female) Treating RN: Roger Shelter Primary Care Bruna Dills: Carles Collet Other Clinician: Referring Devaun Hernandez: Carles Collet Treating Blessing Ozga/Extender: Tito Dine in Treatment: 0 Fall Risk Assessment Items Have you had 2 or more falls in the last 12 monthso 0 No Have you had any fall that resulted in injury in the last 12 monthso 0 No FALL RISK ASSESSMENT: History of falling - immediate or within 3 months 0 No Secondary diagnosis 0 No Ambulatory aid None/bed rest/wheelchair/nurse 0 No Crutches/cane/walker 0 No Furniture  0 No IV Access/Saline Lock 0 No Gait/Training Normal/bed rest/immobile 0 No Weak 0 No Impaired 0 No Mental Status Oriented to own ability 0 No Electronic Signature(s) Signed: 04/16/2018 4:10:19 PM By: Roger Shelter Entered By: Roger Shelter on 04/16/2018 10:11:55 Natalie Lucas ([id number]) -------------------------------------------------------------------------------- Nutrition Risk Assessment Details Patient Name: Natalie Lucas Date of Service: 04/16/2018 9:45 AM Medical Record Number: [id number] Patient Account Number: [id number] Date of Birth/Sex: 1927-08-31 (84 y.o. Female) Treating RN: Roger Shelter Primary Care Cierah Crader: Carles Collet Other Clinician: Referring Ethelmae Ringel: Carles Collet Treating Marquavion Venhuizen/Extender: Tito Dine in Treatment: 0 Height (in): 65 Weight (lbs): 179 Body Mass Index (BMI): 29.8 Nutrition Risk Assessment Items NUTRITION RISK SCREEN: I have an illness or condition that made me change the kind and/or amount of 0 No food I eat I eat fewer than two meals per day 0 No I eat few fruits and vegetables, or milk products 0 No I have three or more drinks of beer, liquor or wine almost every day 0  No I have tooth or mouth problems that make it hard for me to eat 0 No I don't always have enough money to buy the food I need 0 No I eat alone most of the time 0 No I take three or more different prescribed or over-the-counter drugs a day 0 No Without wanting to, I have lost or gained 10 pounds in the last six months 0 No I am not always physically able to shop, cook and/or feed myself 0 No Nutrition Protocols Good Risk Protocol 0 No interventions needed Moderate Risk Protocol Electronic Signature(s) Signed: 04/16/2018 4:10:19 PM By: Roger Shelter Entered By: Roger Shelter on 04/16/2018 10:12:49

## 2018-04-21 DIAGNOSIS — C44619 Basal cell carcinoma of skin of left upper limb, including shoulder: Secondary | ICD-10-CM | POA: Diagnosis not present

## 2018-04-21 DIAGNOSIS — L570 Actinic keratosis: Secondary | ICD-10-CM | POA: Diagnosis not present

## 2018-04-21 DIAGNOSIS — T07XXXA Unspecified multiple injuries, initial encounter: Secondary | ICD-10-CM | POA: Diagnosis not present

## 2018-04-23 ENCOUNTER — Encounter: Payer: Medicare HMO | Admitting: Internal Medicine

## 2018-04-23 DIAGNOSIS — M810 Age-related osteoporosis without current pathological fracture: Secondary | ICD-10-CM | POA: Diagnosis not present

## 2018-04-23 DIAGNOSIS — I4892 Unspecified atrial flutter: Secondary | ICD-10-CM | POA: Diagnosis not present

## 2018-04-23 DIAGNOSIS — I10 Essential (primary) hypertension: Secondary | ICD-10-CM | POA: Diagnosis not present

## 2018-04-23 DIAGNOSIS — M419 Scoliosis, unspecified: Secondary | ICD-10-CM | POA: Diagnosis not present

## 2018-04-23 DIAGNOSIS — L89322 Pressure ulcer of left buttock, stage 2: Secondary | ICD-10-CM | POA: Diagnosis not present

## 2018-04-23 DIAGNOSIS — D649 Anemia, unspecified: Secondary | ICD-10-CM | POA: Diagnosis not present

## 2018-04-25 NOTE — Progress Notes (Signed)
SHIGEKO, MANARD ([id number]) Visit Report for 04/23/2018 Arrival Information Details Patient Name: Natalie Lucas, Natalie Lucas Date of Service: 04/23/2018 10:30 AM Medical Record Number: [id number] Patient Account Number: [id number] Date of Birth/Sex: [birth year] (84 y.o. F) Treating RN: Montey Hora Primary Care Naba Sneed: Carles Collet Other Clinician: Referring Zarin Hagmann: Carles Collet Treating Aleshia Cartelli/Extender: Tito Dine in Treatment: 1 Visit Information History Since Last Visit All ordered tests and consults were completed: No Patient Arrived: Cane Added or deleted any medications: No Arrival Time: 10:39 Any new allergies or adverse reactions: No Accompanied By: self Had a fall or experienced change in No Transfer Assistance: EasyPivot Patient activities of daily living that may affect Lift risk of falls: Patient Identification Verified: Yes Signs or symptoms of abuse/neglect since last visito No Secondary Verification Process Yes Hospitalized since last visit: No Completed: Implantable device outside of the clinic excluding No Patient Requires Transmission-Based No cellular tissue based products placed in the center Precautions: since last visit: Patient Has Alerts: No Has Dressing in Place as Prescribed: Yes Pain Present Now: No Electronic Signature(s) Signed: 04/23/2018 5:20:21 PM By: Montey Hora Entered By: Montey Hora on 04/23/2018 10:39:59 Natalie Lucas ([id number]) -------------------------------------------------------------------------------- Clinic Level of Care Assessment Details Patient Name: Natalie Lucas Date of Service: 04/23/2018 10:30 AM Medical Record Number: [id number] Patient Account Number: [id number] Date of Birth/Sex: 10-25-1927 (84 y.o. F) Treating RN: Cornell Barman Primary Care Dolly Harbach: Carles Collet Other Clinician: Referring Arlita Buffkin: Carles Collet Treating Soniya Ashraf/Extender: Tito Dine in Treatment: 1 Clinic Level  of Care Assessment Items TOOL 4 Quantity Score []  - Use when only an EandM is performed on FOLLOW-UP visit 0 ASSESSMENTS - Nursing Assessment / Reassessment []  - Reassessment of Co-morbidities (includes updates in patient status) 0 X- 1 5 Reassessment of Adherence to Treatment Plan ASSESSMENTS - Wound and Skin Assessment / Reassessment X - Simple Wound Assessment / Reassessment - one wound 1 5 []  - 0 Complex Wound Assessment / Reassessment - multiple wounds []  - 0 Dermatologic / Skin Assessment (not related to wound area) ASSESSMENTS - Focused Assessment []  - Circumferential Edema Measurements - multi extremities 0 []  - 0 Nutritional Assessment / Counseling / Intervention []  - 0 Lower Extremity Assessment (monofilament, tuning fork, pulses) []  - 0 Peripheral Arterial Disease Assessment (using hand held doppler) ASSESSMENTS - Ostomy and/or Continence Assessment and Care []  - Incontinence Assessment and Management 0 []  - 0 Ostomy Care Assessment and Management (repouching, etc.) PROCESS - Coordination of Care X - Simple Patient / Family Education for ongoing care 1 15 []  - 0 Complex (extensive) Patient / Family Education for ongoing care []  - 0 Staff obtains Programmer, systems, Records, Test Results / Process Orders []  - 0 Staff telephones HHA, Nursing Homes / Clarify orders / etc []  - 0 Routine Transfer to another Facility (non-emergent condition) []  - 0 Routine Hospital Admission (non-emergent condition) []  - 0 New Admissions / Biomedical engineer / Ordering NPWT, Apligraf, etc. []  - 0 Emergency Hospital Admission (emergent condition) X- 1 10 Simple Discharge Coordination Frickey, Samirah T. ([id number]) []  - 0 Complex (extensive) Discharge Coordination PROCESS - Special Needs []  - Pediatric / Minor Patient Management 0 []  - 0 Isolation Patient Management []  - 0 Hearing / Language / Visual special needs []  - 0 Assessment of Community assistance (transportation, D/C  planning, etc.) []  - 0 Additional assistance / Altered mentation []  - 0 Support Surface(s) Assessment (bed, cushion, seat, etc.) INTERVENTIONS - Wound Cleansing / Measurement []  - Simple Wound Cleansing -  one wound 0 []  - 0 Complex Wound Cleansing - multiple wounds X- 1 5 Wound Imaging (photographs - any number of wounds) []  - 0 Wound Tracing (instead of photographs) []  - 0 Simple Wound Measurement - one wound []  - 0 Complex Wound Measurement - multiple wounds INTERVENTIONS - Wound Dressings []  - Small Wound Dressing one or multiple wounds 0 []  - 0 Medium Wound Dressing one or multiple wounds []  - 0 Large Wound Dressing one or multiple wounds []  - 0 Application of Medications - topical []  - 0 Application of Medications - injection INTERVENTIONS - Miscellaneous []  - External ear exam 0 []  - 0 Specimen Collection (cultures, biopsies, blood, body fluids, etc.) []  - 0 Specimen(s) / Culture(s) sent or taken to Lab for analysis []  - 0 Patient Transfer (multiple staff / Civil Service fast streamer / Similar devices) []  - 0 Simple Staple / Suture removal (25 or less) []  - 0 Complex Staple / Suture removal (26 or more) []  - 0 Hypo / Hyperglycemic Management (close monitor of Blood Glucose) []  - 0 Ankle / Brachial Index (ABI) - do not check if billed separately X- 1 5 Vital Signs Mittman, Ladeidra T. ([id number]) Has the patient been seen at the hospital within the last three years: Yes Total Score: 45 Level Of Care: New/Established - Level 2 Electronic Signature(s) Signed: 04/23/2018 5:44:56 PM By: Gretta Cool, BSN, RN, CWS, Kim RN, BSN Entered By: Gretta Cool, BSN, RN, CWS, Kim on 04/23/2018 10:56:57 Natalie Lucas ([id number]) -------------------------------------------------------------------------------- Encounter Discharge Information Details Patient Name: Natalie Lucas Date of Service: 04/23/2018 10:30 AM Medical Record Number: [id number] Patient Account Number: [id number] Date of Birth/Sex:  1927/08/11 (84 y.o. F) Treating RN: Cornell Barman Primary Care Spike Desilets: Carles Collet Other Clinician: Referring Findlay Dagher: Carles Collet Treating Ivette Castronova/Extender: Tito Dine in Treatment: 1 Encounter Discharge Information Items Discharge Condition: Stable Ambulatory Status: Cane Discharge Destination: Home Transportation: Private Auto Accompanied By: self Schedule Follow-up Appointment: Yes Clinical Summary of Care: Electronic Signature(s) Signed: 04/23/2018 5:44:56 PM By: Gretta Cool, BSN, RN, CWS, Kim RN, BSN Entered By: Gretta Cool, BSN, RN, CWS, Kim on 04/23/2018 10:57:43 Natalie Lucas ([id number]) -------------------------------------------------------------------------------- Lower Extremity Assessment Details Patient Name: Natalie Lucas Date of Service: 04/23/2018 10:30 AM Medical Record Number: [id number] Patient Account Number: [id number] Date of Birth/Sex: 1927-10-22 (84 y.o. F) Treating RN: Montey Hora Primary Care Belem Hintze: Carles Collet Other Clinician: Referring Melody Savidge: Carles Collet Treating Hasset Chaviano/Extender: Tito Dine in Treatment: 1 Electronic Signature(s) Signed: 04/23/2018 5:20:21 PM By: Montey Hora Entered By: Montey Hora on 04/23/2018 10:47:09 Porchia, Loyal Jacobson ([id number]) -------------------------------------------------------------------------------- Multi Wound Chart Details Patient Name: Natalie Lucas Date of Service: 04/23/2018 10:30 AM Medical Record Number: [id number] Patient Account Number: [id number] Date of Birth/Sex: June 18, 1927 (84 y.o. F) Treating RN: Cornell Barman Primary Care Analyse Angst: Carles Collet Other Clinician: Referring Kolbee Bogusz: Carles Collet Treating Nobel Brar/Extender: Tito Dine in Treatment: 1 Vital Signs Height(in): 65 Pulse(bpm): 71 Weight(lbs): 179 Blood Pressure(mmHg): 109/64 Body Mass Index(BMI): 30 Temperature(F): 98.2 Respiratory Rate 18 (breaths/min): Photos: [1:No  Photos] [N/A:N/A] Wound Location: [1:Left Gluteus - Distal] [N/A:N/A] Wounding Event: [1:Blister] [N/A:N/A] Primary Etiology: [1:Pressure Ulcer] [N/A:N/A] Comorbid History: [1:Cataracts, Anemia, Arrhythmia, Hypertension, Osteoarthritis] [N/A:N/A] Date Acquired: [1:04/11/2018] [N/A:N/A] Weeks of Treatment: [1:1] [N/A:N/A] Wound Status: [1:Open] [N/A:N/A] Measurements L x W x D [1:0.1x0.1x0.1] [N/A:N/A] (cm) Area (cm) : [1:0.008] [N/A:N/A] Volume (cm) : [1:0.001] [N/A:N/A] % Reduction in Area: [1:99.80%] [N/A:N/A] % Reduction in Volume: [1:99.70%] [N/A:N/A] Classification: [1:Category/Stage II] [N/A:N/A] Exudate Amount: [1:None Present] [N/A:N/A] Wound  Margin: [1:Distinct, outline attached] [N/A:N/A] Granulation Amount: [1:Large (67-100%)] [N/A:N/A] Granulation Quality: [1:Red] [N/A:N/A] Necrotic Amount: [1:None Present (0%)] [N/A:N/A] Exposed Structures: [1:Fascia: No Fat Layer (Subcutaneous Tissue) Exposed: No Tendon: No Muscle: No Joint: No Bone: No] [N/A:N/A] Epithelialization: [1:Large (67-100%)] [N/A:N/A] Periwound Skin Texture: [1:Excoriation: No Induration: No Callus: No Crepitus: No Rash: No Scarring: No] [N/A:N/A] Periwound Skin Moisture: [N/A:N/A] Maceration: No Dry/Scaly: No Periwound Skin Color: Atrophie Blanche: No N/A N/A Cyanosis: No Ecchymosis: No Erythema: No Hemosiderin Staining: No Mottled: No Pallor: No Rubor: No Temperature: No Abnormality N/A N/A Tenderness on Palpation: No N/A N/A Wound Preparation: Ulcer Cleansing: N/A N/A Rinsed/Irrigated with Saline Topical Anesthetic Applied: None Treatment Notes Electronic Signature(s) Signed: 04/23/2018 5:44:56 PM By: Gretta Cool, BSN, RN, CWS, Kim RN, BSN Entered By: Gretta Cool, BSN, RN, CWS, Kim on 04/23/2018 10:53:35 Natalie Lucas ([id number]) -------------------------------------------------------------------------------- [place] Details Patient Name: Natalie Lucas Date of Service:  04/23/2018 10:30 AM Medical Record Number: [id number] Patient Account Number: [id number] Date of Birth/Sex: 03-28-1927 (84 y.o. F) Treating RN: Cornell Barman Primary Care Akeira Lahm: Carles Collet Other Clinician: Referring Terilyn Sano: Carles Collet Treating Shandora Koogler/Extender: Tito Dine in Treatment: 1 Active Inactive Electronic Signature(s) Signed: 04/23/2018 11:50:22 AM By: Gretta Cool, BSN, RN, CWS, Kim RN, BSN Entered By: Gretta Cool, BSN, RN, CWS, Kim on 04/23/2018 11:50:21 Natalie Lucas ([id number]) -------------------------------------------------------------------------------- Pain Assessment Details Patient Name: Natalie Lucas Date of Service: 04/23/2018 10:30 AM Medical Record Number: [id number] Patient Account Number: [id number] Date of Birth/Sex: 10-06-1927 (84 y.o. F) Treating RN: Montey Hora Primary Care Caroleen Stoermer: Carles Collet Other Clinician: Referring Harnoor Kohles: Carles Collet Treating Bird Swetz/Extender: Tito Dine in Treatment: 1 Active Problems Location of Pain Severity and Description of Pain Patient Has Paino No Site Locations Pain Management and Medication Current Pain Management: Electronic Signature(s) Signed: 04/23/2018 5:20:21 PM By: Montey Hora Entered By: Montey Hora on 04/23/2018 10:40:05 Natalie Lucas ([id number]) -------------------------------------------------------------------------------- Patient/Caregiver Education Details Patient Name: Natalie Lucas Date of Service: 04/23/2018 10:30 AM Medical Record Number: [id number] Patient Account Number: [id number] Date of Birth/Gender: Mar 28, 1927 (84 y.o. F) Treating RN: Cornell Barman Primary Care Physician: Carles Collet Other Clinician: Referring Physician: Carles Collet Treating Physician/Extender: Tito Dine in Treatment: 1 Education Assessment Education Provided To: Patient Education Topics Provided Basic Hygiene: Handouts: Other: moisturizer Wound/Skin  Impairment: Engineer, maintenance) Signed: 04/23/2018 5:44:56 PM By: Gretta Cool, BSN, RN, CWS, Kim RN, BSN Entered By: Gretta Cool, BSN, RN, CWS, Kim on 04/23/2018 10:58:39 Natalie Lucas ([id number]) -------------------------------------------------------------------------------- Wound Assessment Details Patient Name: Natalie Lucas Date of Service: 04/23/2018 10:30 AM Medical Record Number: [id number] Patient Account Number: [id number] Date of Birth/Sex: 1927/06/05 (84 y.o. F) Treating RN: Cornell Barman Primary Care Jaxon Mynhier: Carles Collet Other Clinician: Referring Rodney Wigger: Carles Collet Treating Lenore Moyano/Extender: Tito Dine in Treatment: 1 Wound Status Wound Number: 1 Primary Pressure Ulcer Etiology: Wound Location: Left, Distal Gluteus Wound Status: Healed - Epithelialized Wounding Event: Blister Comorbid Cataracts, Anemia, Arrhythmia, Date Acquired: 04/11/2018 History: Hypertension, Osteoarthritis Weeks Of Treatment: 1 Clustered Wound: No Wound Measurements Length: (cm) 0 % Width: (cm) 0 % Depth: (cm) 0 E Area: (cm) 0 Volume: (cm) 0 Reduction in Area: 100% Reduction in Volume: 100% pithelialization: Large (67-100%) Tunneling: No Undermining: No Wound Description Classification: Category/Stage II Wound Margin: Distinct, outline attached Exudate Amount: None Present Foul Odor After Cleansing: No Slough/Fibrino Yes Wound Bed Granulation Amount: Large (67-100%) Exposed Structure Granulation Quality: Red Fascia Exposed: No Necrotic Amount: None Present (0%) Fat Layer (Subcutaneous Tissue) Exposed: No  Tendon Exposed: No Muscle Exposed: No Joint Exposed: No Bone Exposed: No Periwound Skin Texture Texture Color No Abnormalities Noted: No No Abnormalities Noted: No Callus: No Atrophie Blanche: No Crepitus: No Cyanosis: No Excoriation: No Ecchymosis: No Induration: No Erythema: No Rash: No Hemosiderin Staining: No Scarring: No Mottled: No Pallor:  No Moisture Rubor: No No Abnormalities Noted: No Dry / Scaly: No Temperature / Pain Maceration: No Temperature: No Abnormality Wound Preparation Ulcer Cleansing: Rinsed/Irrigated with Saline BENNIE, SCAFF ([id number]) Topical Anesthetic Applied: None Electronic Signature(s) Signed: 04/23/2018 5:44:56 PM By: Gretta Cool, BSN, RN, CWS, Kim RN, BSN Entered By: Gretta Cool, BSN, RN, CWS, Kim on 04/23/2018 10:57:20 Stenberg, Loyal Jacobson ([id number]) -------------------------------------------------------------------------------- Vitals Details Patient Name: Natalie Lucas Date of Service: 04/23/2018 10:30 AM Medical Record Number: [id number] Patient Account Number: [id number] Date of Birth/Sex: 09-05-1927 (84 y.o. F) Treating RN: Montey Hora Primary Care Mizraim Harmening: Carles Collet Other Clinician: Referring Rishik Tubby: Carles Collet Treating Brylyn Novakovich/Extender: Tito Dine in Treatment: 1 Vital Signs Time Taken: 10:40 Temperature (F): 98.2 Height (in): 65 Pulse (bpm): 67 Weight (lbs): 179 Respiratory Rate (breaths/min): 18 Body Mass Index (BMI): 29.8 Blood Pressure (mmHg): 109/64 Reference Range: 80 - 120 mg / dl Electronic Signature(s) Signed: 04/23/2018 5:20:21 PM By: Montey Hora Entered By: Montey Hora on 04/23/2018 10:42:24

## 2018-04-25 NOTE — Progress Notes (Signed)
Natalie Lucas, Natalie Lucas ([id number]) Visit Report for 04/23/2018 HPI Details Patient Name: [name], [name] Date of Service: 04/23/2018 10:30 AM Medical Record Number: [id number] Patient Account Number: [id number] Date of Birth/Sex: [birth year] (84 y.o. F) Treating RN: Cornell Barman Primary Care Provider: Carles Collet Other Clinician: Referring Provider: Carles Collet Treating Provider/Extender: Tito Dine in Treatment: 1 History of Present Illness HPI Description: ADMISSION 04/16/18; This is an otherwise independent 84 year old woman who saw her primary doctor on 04/09/18 when she became aware of the uncomfortable feeling in her lower left buttock. The history was that she had been using a "Garden scooter" and working in her yard for several straight days. Her primary doctor noted a blister with pain and redness. She lives alone and has not been able to specifically address this. The blister is fully unroofed and is a stage II pressure injury. She had a comprehensive metabolic panel which was normal including a normal serum albumin on 03/11/18. Patient has a history of hypertension, atrial flutter, osteoporosis. she tells me she has a scoliosis in her spine which keeps her shifted over to her left when she is sitting which probably explains why this happened in the left side and nothing is present on the right. 04/23/18; the patient's area on the left lower buttock is fully epithelialized. The patient is aware that she puts excessive amounts of pressure on this area and we'll try to avoid it. Electronic Signature(s) Signed: 04/23/2018 6:35:58 PM By: Linton Ham MD Entered By: Linton Ham on 04/23/2018 12:11:34 Natalie Lucas ([id number]) -------------------------------------------------------------------------------- Physical Exam Details Patient Name: Natalie Lucas Date of Service: 04/23/2018 10:30 AM Medical Record Number: [id number] Patient Account Number: [id number] Date of  Birth/Sex: [birth year] (84 y.o. F) Treating RN: Cornell Barman Primary Care Provider: Carles Collet Other Clinician: Referring Provider: Carles Collet Treating Provider/Extender: Tito Dine in Treatment: 1 Constitutional Sitting or standing Blood Pressure is within target range for patient.. Pulse regular and within target range for patient.Marland Kitchen Respirations regular, non-labored and within target range.. Temperature is normal and within the target range for the patient.Marland Kitchen appears in no distress. Notes wound exam; near in the lower left buttock is fully epithelialized. There is no evidence of surrounding infection Electronic Signature(s) Signed: 04/23/2018 6:35:58 PM By: Linton Ham MD Entered By: Linton Ham on 04/23/2018 12:12:51 Natalie Lucas ([id number]) -------------------------------------------------------------------------------- Physician Orders Details Patient Name: Natalie Lucas Date of Service: 04/23/2018 10:30 AM Medical Record Number: [id number] Patient Account Number: [id number] Date of Birth/Sex: [birth year] (84 y.o. F) Treating RN: Cornell Barman Primary Care Provider: Carles Collet Other Clinician: Referring Provider: Carles Collet Treating Provider/Extender: Tito Dine in Treatment: 1 Verbal / Phone Orders: No Diagnosis Coding Discharge From [facility] Services Wound #1 Left,Distal Gluteus o Discharge from [facility] - treatment complete Electronic Signature(s) Signed: 04/23/2018 5:44:56 PM By: Gretta Cool, BSN, RN, CWS, Kim RN, BSN Signed: 04/23/2018 6:35:58 PM By: Linton Ham MD Entered By: Gretta Cool, BSN, RN, CWS, Kim on 04/23/2018 10:56:25 Natalie Lucas, Natalie Lucas ([id number]) -------------------------------------------------------------------------------- Problem List Details Patient Name: Natalie Lucas Date of Service: 04/23/2018 10:30 AM Medical Record Number: [id number] Patient Account Number: [id number] Date of Birth/Sex: 29-Nov-1926 (84 y.o.  F) Treating RN: Cornell Barman Primary Care Provider: Carles Collet Other Clinician: Referring Provider: Carles Collet Treating Provider/Extender: Tito Dine in Treatment: 1 Active Problems ICD-10 Evaluated Encounter Code Description Active Date Today Diagnosis [phone] Pressure ulcer of left buttock, stage 2 04/16/2018 Yes Yes Status Complications Interventions Other the patient  is healed fully epithelialized secondary prevention will include the Medical knowledge that Decision pressure ulcers can Making : happen very quickly. We talked about offloading this area in the future Inactive Problems Resolved Problems Electronic Signature(s) Signed: 04/23/2018 6:35:58 PM By: Linton Ham MD Entered By: Linton Ham on 04/23/2018 12:10:39 Natalie Lucas ([id number]) -------------------------------------------------------------------------------- Progress Note Details Patient Name: Natalie Lucas Date of Service: 04/23/2018 10:30 AM Medical Record Number: [id number] Patient Account Number: [id number] Date of Birth/Sex: 22-May-1927 (84 y.o. F) Treating RN: Cornell Barman Primary Care Provider: Carles Collet Other Clinician: Referring Provider: Carles Collet Treating Provider/Extender: Tito Dine in Treatment: 1 Subjective History of Present Illness (HPI) ADMISSION 04/16/18; This is an otherwise independent 84 year old woman who saw her primary doctor on 04/09/18 when she became aware of the uncomfortable feeling in her lower left buttock. The history was that she had been using a "Garden scooter" and working in her yard for several straight days. Her primary doctor noted a blister with pain and redness. She lives alone and has not been able to specifically address this. The blister is fully unroofed and is a stage II pressure injury. She had a comprehensive metabolic panel which was normal including a normal serum albumin on 03/11/18. Patient has a  history of hypertension, atrial flutter, osteoporosis. she tells me she has a scoliosis in her spine which keeps her shifted over to her left when she is sitting which probably explains why this happened in the left side and nothing is present on the right. 04/23/18; the patient's area on the left lower buttock is fully epithelialized. The patient is aware that she puts excessive amounts of pressure on this area and we'll try to avoid it. Objective Constitutional Sitting or standing Blood Pressure is within target range for patient.. Pulse regular and within target range for patient.Marland Kitchen Respirations regular, non-labored and within target range.. Temperature is normal and within the target range for the patient.Marland Kitchen appears in no distress. Vitals Time Taken: 10:40 AM, Height: 65 in, Weight: 179 lbs, BMI: 29.8, Temperature: 98.2 F, Pulse: 67 bpm, Respiratory Rate: 18 breaths/min, Blood Pressure: 109/64 mmHg. General Notes: wound exam; near in the lower left buttock is fully epithelialized. There is no evidence of surrounding infection Integumentary (Hair, Skin) Wound #1 status is Healed - Epithelialized. Original cause of wound was Blister. The wound is located on the Left,Distal Gluteus. The wound measures 0cm length x 0cm width x 0cm depth; 0cm^2 area and 0cm^3 volume. There is no tunneling or undermining noted. There is a none present amount of drainage noted. The wound margin is distinct with the outline attached to the wound base. There is large (67-100%) red granulation within the wound bed. There is no necrotic tissue within the wound bed. The periwound skin appearance did not exhibit: Callus, Crepitus, Excoriation, Induration, Rash, Scarring, Dry/Scaly, Maceration, Atrophie Blanche, Cyanosis, Ecchymosis, Hemosiderin Staining, Mottled, Pallor, Rubor, Erythema. Periwound temperature was noted as No Abnormality. Phifer, Lenix T. ([id number]) Assessment Active Problems ICD-10 Pressure ulcer of  left buttock, stage 2 Plan Discharge From [facility] Services: Wound #1 Left,Distal Gluteus: Discharge from [facility] - treatment complete Medical Decision Making Pressure ulcer of left buttock, stage 2 04/16/2018 Status: Other Complications: the patient is healed fully epithelialized Interventions: secondary prevention will include the knowledge that pressure ulcers can happen very quickly. We talked about offloading this area in the future #1 this is healed. 2 this happened while she was working in her yard and garden scooter. The patient  is aware of how this happened. We counseled secondary prevention Electronic Signature(s) Signed: 04/23/2018 6:35:58 PM By: Linton Ham MD Entered By: Linton Ham on 04/23/2018 12:14:43 Natalie Lucas ([id number]) -------------------------------------------------------------------------------- SuperBill Details Patient Name: Natalie Lucas Date of Service: 04/23/2018 Medical Record Number: [id number] Patient Account Number: [id number] Date of Birth/Sex: 01/21/1927 (84 y.o. F) Treating RN: Cornell Barman Primary Care Provider: Carles Collet Other Clinician: Referring Provider: Carles Collet Treating Provider/Extender: Tito Dine in Treatment: 1 Diagnosis Coding ICD-10 Codes Code Description [id number] Pressure ulcer of left buttock, stage 2 Facility Procedures CPT4 Code: [id number] Description: [phone] - WOUND CARE VISIT-LEV 2 EST PT Modifier: Quantity: 1 Physician Procedures CPT4 Code: [id number] Description: 32122 - WC PHYS LEVEL 2 - EST PT ICD-10 Diagnosis Description L89.322 Pressure ulcer of left buttock, stage 2 Modifier: Quantity: 1 Electronic Signature(s) Signed: 04/23/2018 6:35:58 PM By: Linton Ham MD Entered By: Linton Ham on 04/23/2018 12:15:00

## 2018-04-28 ENCOUNTER — Ambulatory Visit
Admission: RE | Admit: 2018-04-28 | Discharge: 2018-04-28 | Disposition: A | Discharge status: Home / Self Care | Payer: Medicare HMO | Source: Ambulatory Visit | Attending: Physician Assistant | Admitting: Physician Assistant

## 2018-04-28 DIAGNOSIS — M810 Age-related osteoporosis without current pathological fracture: Secondary | ICD-10-CM

## 2018-04-28 DIAGNOSIS — M85859 Other specified disorders of bone density and structure, unspecified thigh: Secondary | ICD-10-CM | POA: Diagnosis not present

## 2018-04-28 DIAGNOSIS — Z780 Asymptomatic menopausal state: Secondary | ICD-10-CM | POA: Diagnosis not present

## 2018-04-30 ENCOUNTER — Telehealth: Payer: Self-pay

## 2018-04-30 NOTE — Telephone Encounter (Signed)
Patient advised. She states she will think about the referral and if she decides to proceed she will call back.

## 2018-04-30 NOTE — Telephone Encounter (Signed)
-----   Message from Trinna Post, [place] sent at 04/29/2018  2:05 PM EDT ----- T score is -4.0 which is worse than last DEXA which was -3.0. This is pretty severe osteoporosis. Patient was started on Fosamax and declined to continue because she did not want to experience side effects. If she would like to explore additional options for treating her osteoporosis, we can refer her to endocrinology.

## 2018-06-03 ENCOUNTER — Other Ambulatory Visit: Payer: Self-pay | Admitting: Physician Assistant

## 2018-06-03 DIAGNOSIS — M810 Age-related osteoporosis without current pathological fracture: Secondary | ICD-10-CM

## 2018-06-03 NOTE — Telephone Encounter (Signed)
[facility] faxed refill request for the following medications:  alendronate (FOSAMAX) 70 MG tablet  Last Rx: 03/14/18 w/2 refills LOV: 04/09/18 Please advise. Thanks TNP

## 2018-06-03 NOTE — Telephone Encounter (Signed)
Pharmacy faxed refill request for the following medications:   Last Rx: LOV: Please advise. Thanks TNP

## 2018-06-04 MED ORDER — ALENDRONATE SODIUM 70 MG PO TABS: 70.0000 mg | ORAL_TABLET | ORAL | 2 refills | Status: DC

## 2018-06-06 ENCOUNTER — Other Ambulatory Visit: Payer: Self-pay | Admitting: Physician Assistant

## 2018-07-02 ENCOUNTER — Other Ambulatory Visit: Payer: Self-pay | Admitting: Physician Assistant

## 2018-07-02 DIAGNOSIS — E785 Hyperlipidemia, unspecified: Secondary | ICD-10-CM

## 2018-07-22 DIAGNOSIS — R0602 Shortness of breath: Secondary | ICD-10-CM | POA: Diagnosis not present

## 2018-07-22 DIAGNOSIS — I10 Essential (primary) hypertension: Secondary | ICD-10-CM | POA: Diagnosis not present

## 2018-07-22 DIAGNOSIS — E785 Hyperlipidemia, unspecified: Secondary | ICD-10-CM | POA: Diagnosis not present

## 2018-07-22 DIAGNOSIS — I4892 Unspecified atrial flutter: Secondary | ICD-10-CM | POA: Diagnosis not present

## 2018-08-09 ENCOUNTER — Ambulatory Visit (INDEPENDENT_AMBULATORY_CARE_PROVIDER_SITE_OTHER): Payer: Medicare HMO

## 2018-08-09 DIAGNOSIS — Z23 Encounter for immunization: Secondary | ICD-10-CM | POA: Diagnosis not present

## 2018-08-29 ENCOUNTER — Other Ambulatory Visit: Payer: Self-pay | Admitting: Physician Assistant

## 2018-08-29 DIAGNOSIS — M810 Age-related osteoporosis without current pathological fracture: Secondary | ICD-10-CM

## 2018-09-01 ENCOUNTER — Other Ambulatory Visit: Payer: Self-pay | Admitting: Physician Assistant

## 2018-09-02 DIAGNOSIS — R0602 Shortness of breath: Secondary | ICD-10-CM | POA: Diagnosis not present

## 2018-09-02 DIAGNOSIS — I10 Essential (primary) hypertension: Secondary | ICD-10-CM | POA: Diagnosis not present

## 2018-09-02 DIAGNOSIS — E785 Hyperlipidemia, unspecified: Secondary | ICD-10-CM | POA: Diagnosis not present

## 2018-09-02 DIAGNOSIS — I4892 Unspecified atrial flutter: Secondary | ICD-10-CM | POA: Diagnosis not present

## 2018-09-23 ENCOUNTER — Telehealth: Payer: Self-pay

## 2018-09-23 DIAGNOSIS — M810 Age-related osteoporosis without current pathological fracture: Secondary | ICD-10-CM

## 2018-09-23 MED ORDER — ALENDRONATE SODIUM 70 MG PO TABS: 70.0000 mg | ORAL_TABLET | ORAL | 2 refills | Status: DC

## 2018-09-23 NOTE — Telephone Encounter (Signed)
Patient told me at our follow up visit regarding this that she did not want to use fosamax due to risk of osteonecrosis. To the best of my knowledge she is not taking this, unless she has decided to in the mean time.

## 2018-09-23 NOTE — Telephone Encounter (Signed)
Sent that in for her.

## 2018-09-23 NOTE — Telephone Encounter (Signed)
Patient called and said she had not told you that she started it back in August and was going to continue but there were no more refills.

## 2018-09-23 NOTE — Telephone Encounter (Signed)
Patient has been using since August and needs new Rx

## 2018-09-30 DIAGNOSIS — L905 Scar conditions and fibrosis of skin: Secondary | ICD-10-CM | POA: Diagnosis not present

## 2018-09-30 DIAGNOSIS — L578 Other skin changes due to chronic exposure to nonionizing radiation: Secondary | ICD-10-CM | POA: Diagnosis not present

## 2018-09-30 DIAGNOSIS — Z85828 Personal history of other malignant neoplasm of skin: Secondary | ICD-10-CM | POA: Diagnosis not present

## 2018-09-30 DIAGNOSIS — L820 Inflamed seborrheic keratosis: Secondary | ICD-10-CM | POA: Diagnosis not present

## 2018-09-30 DIAGNOSIS — L570 Actinic keratosis: Secondary | ICD-10-CM | POA: Diagnosis not present

## 2018-10-14 ENCOUNTER — Telehealth: Payer: Self-pay

## 2018-10-14 NOTE — Telephone Encounter (Signed)
LMTCB regarding the TB testing.

## 2018-10-16 NOTE — Telephone Encounter (Signed)
LMTCB at [phone] for [facility] with [facility].

## 2018-10-17 NOTE — Telephone Encounter (Signed)
Faxed forms to [facility] this morning but still haven't hurd from them regarding the TB testing.

## 2018-12-18 ENCOUNTER — Other Ambulatory Visit: Payer: Self-pay | Admitting: Physician Assistant

## 2018-12-18 DIAGNOSIS — M810 Age-related osteoporosis without current pathological fracture: Secondary | ICD-10-CM

## 2019-02-02 ENCOUNTER — Other Ambulatory Visit: Payer: Self-pay | Admitting: Physician Assistant

## 2019-02-02 DIAGNOSIS — M810 Age-related osteoporosis without current pathological fracture: Secondary | ICD-10-CM

## 2019-02-02 DIAGNOSIS — E785 Hyperlipidemia, unspecified: Secondary | ICD-10-CM

## 2019-02-02 NOTE — Telephone Encounter (Signed)
Please Review

## 2019-02-03 ENCOUNTER — Other Ambulatory Visit: Payer: Self-pay | Admitting: Physician Assistant

## 2019-02-03 DIAGNOSIS — M810 Age-related osteoporosis without current pathological fracture: Secondary | ICD-10-CM

## 2019-03-13 ENCOUNTER — Ambulatory Visit: Payer: Self-pay

## 2019-05-01 ENCOUNTER — Other Ambulatory Visit: Payer: Self-pay | Admitting: Physician Assistant

## 2019-05-01 DIAGNOSIS — E785 Hyperlipidemia, unspecified: Secondary | ICD-10-CM

## 2019-07-30 ENCOUNTER — Other Ambulatory Visit: Payer: Self-pay

## 2019-07-30 ENCOUNTER — Encounter: Payer: Self-pay | Admitting: Physician Assistant

## 2019-07-30 ENCOUNTER — Ambulatory Visit (INDEPENDENT_AMBULATORY_CARE_PROVIDER_SITE_OTHER): Payer: Medicare HMO | Admitting: Physician Assistant

## 2019-07-30 VITALS — BP 130/85 | HR 94 | Temp 96.9°F | Resp 16 | Ht 64.0 in | Wt 183.0 lb

## 2019-07-30 DIAGNOSIS — Z23 Encounter for immunization: Secondary | ICD-10-CM | POA: Diagnosis not present

## 2019-07-30 DIAGNOSIS — M810 Age-related osteoporosis without current pathological fracture: Secondary | ICD-10-CM

## 2019-07-30 DIAGNOSIS — M25561 Pain in right knee: Secondary | ICD-10-CM | POA: Diagnosis not present

## 2019-07-30 DIAGNOSIS — I4892 Unspecified atrial flutter: Secondary | ICD-10-CM | POA: Diagnosis not present

## 2019-07-30 DIAGNOSIS — Z0000 Encounter for general adult medical examination without abnormal findings: Secondary | ICD-10-CM

## 2019-07-30 DIAGNOSIS — E785 Hyperlipidemia, unspecified: Secondary | ICD-10-CM | POA: Diagnosis not present

## 2019-07-30 DIAGNOSIS — G8929 Other chronic pain: Secondary | ICD-10-CM

## 2019-07-30 DIAGNOSIS — R7303 Prediabetes: Secondary | ICD-10-CM

## 2019-07-30 DIAGNOSIS — I10 Essential (primary) hypertension: Secondary | ICD-10-CM | POA: Diagnosis not present

## 2019-07-30 MED ORDER — ALENDRONATE SODIUM 70 MG PO TABS: 70.0000 mg | ORAL_TABLET | ORAL | 3 refills | Status: AC

## 2019-07-30 NOTE — Patient Instructions (Signed)
Health Maintenance, Female Adopting a healthy lifestyle and getting preventive care are important in promoting health and wellness. Ask your health care provider about:  The right schedule for you to have regular tests and exams.  Things you can do on your own to prevent diseases and keep yourself healthy. What should I know about diet, weight, and exercise? Eat a healthy diet   Eat a diet that includes plenty of vegetables, fruits, low-fat dairy products, and lean protein.  Do not eat a lot of foods that are high in solid fats, added sugars, or sodium. Maintain a healthy weight Body mass index (BMI) is used to identify weight problems. It estimates body fat based on height and weight. Your health care provider can help determine your BMI and help you achieve or maintain a healthy weight. Get regular exercise Get regular exercise. This is one of the most important things you can do for your health. Most adults should:  Exercise for at least 150 minutes each week. The exercise should increase your heart rate and make you sweat (moderate-intensity exercise).  Do strengthening exercises at least twice a week. This is in addition to the moderate-intensity exercise.  Spend less time sitting. Even light physical activity can be beneficial. Watch cholesterol and blood lipids Have your blood tested for lipids and cholesterol at 83 years of age, then have this test every 5 years. Have your cholesterol levels checked more often if:  Your lipid or cholesterol levels are high.  You are older than 83 years of age.  You are at high risk for heart disease. What should I know about cancer screening? Depending on your health history and family history, you may need to have cancer screening at various ages. This may include screening for:  Breast cancer.  Cervical cancer.  Colorectal cancer.  Skin cancer.  Lung cancer. What should I know about heart disease, diabetes, and high blood  pressure? Blood pressure and heart disease  High blood pressure causes heart disease and increases the risk of stroke. This is more likely to develop in people who have high blood pressure readings, are of African descent, or are overweight.  Have your blood pressure checked: ? Every 3-5 years if you are 18-39 years of age. ? Every year if you are 40 years old or older. Diabetes Have regular diabetes screenings. This checks your fasting blood sugar level. Have the screening done:  Once every three years after age 40 if you are at a normal weight and have a low risk for diabetes.  More often and at a younger age if you are overweight or have a high risk for diabetes. What should I know about preventing infection? Hepatitis B If you have a higher risk for hepatitis B, you should be screened for this virus. Talk with your health care provider to find out if you are at risk for hepatitis B infection. Hepatitis C Testing is recommended for:  Everyone born from 1945 through 1965.  Anyone with known risk factors for hepatitis C. Sexually transmitted infections (STIs)  Get screened for STIs, including gonorrhea and chlamydia, if: ? You are sexually active and are younger than 83 years of age. ? You are older than 83 years of age and your health care provider tells you that you are at risk for this type of infection. ? Your sexual activity has changed since you were last screened, and you are at increased risk for chlamydia or gonorrhea. Ask your health care provider if   you are at risk.  Ask your health care provider about whether you are at high risk for HIV. Your health care provider may recommend a prescription medicine to help prevent HIV infection. If you choose to take medicine to prevent HIV, you should first get tested for HIV. You should then be tested every 3 months for as long as you are taking the medicine. Pregnancy  If you are about to stop having your period (premenopausal) and  you may become pregnant, seek counseling before you get pregnant.  Take 400 to 800 micrograms (mcg) of folic acid every day if you become pregnant.  Ask for birth control (contraception) if you want to prevent pregnancy. Osteoporosis and menopause Osteoporosis is a disease in which the bones lose minerals and strength with aging. This can result in bone fractures. If you are 65 years old or older, or if you are at risk for osteoporosis and fractures, ask your health care provider if you should:  Be screened for bone loss.  Take a calcium or vitamin D supplement to lower your risk of fractures.  Be given hormone replacement therapy (HRT) to treat symptoms of menopause. Follow these instructions at home: Lifestyle  Do not use any products that contain nicotine or tobacco, such as cigarettes, e-cigarettes, and chewing tobacco. If you need help quitting, ask your health care provider.  Do not use street drugs.  Do not share needles.  Ask your health care provider for help if you need support or information about quitting drugs. Alcohol use  Do not drink alcohol if: ? Your health care provider tells you not to drink. ? You are pregnant, may be pregnant, or are planning to become pregnant.  If you drink alcohol: ? Limit how much you use to 0-1 drink a day. ? Limit intake if you are breastfeeding.  Be aware of how much alcohol is in your drink. In the [place] one drink equals one 12 oz bottle of beer (355 mL), one 5 oz glass of wine (148 mL), or one 1 oz glass of hard liquor (44 mL). General instructions  Schedule regular health, dental, and eye exams.  Stay current with your vaccines.  Tell your health care provider if: ? You often feel depressed. ? You have ever been abused or do not feel safe at home. Summary  Adopting a healthy lifestyle and getting preventive care are important in promoting health and wellness.  Follow your health care provider's instructions about healthy  diet, exercising, and getting tested or screened for diseases.  Follow your health care provider's instructions on monitoring your cholesterol and blood pressure. This information is not intended to replace advice given to you by your health care provider. Make sure you discuss any questions you have with your health care provider. Document Released: 04/30/2011 Document Revised: 10/08/2018 Document Reviewed: 10/08/2018 [employer]vier Patient Education  2020 [employer]  

## 2019-07-30 NOTE — Progress Notes (Signed)
Patient: Natalie Lucas, Female    DOB: [birth year], 85 y.o.   MRN: [id number] Visit Date: 07/30/2019  Today's Provider: Trinna Post, PA-C   Chief Complaint  Patient presents with  . Medicare Wellness   Subjective:     Annual wellness visit Natalie Lucas is a 85 y.o. female. She feels well. She reports exercising no. She reports she is sleeping well. 03/11/2018 CPE  Continuing care at [facility] [place] at independent living. Sold her house of 64 years. Most of her neighbors had passed away.   She is walking with the use of a cane. She is currently using a walker to get around. She had it for several years but has been using it more latelt. She is currently living on the fourth floor of her apartment. Reports poor balance. Her walker is a rolling walker with a seat. Thinking about scooter. She has severe scoliosis which was diagnosed during a postmenopausal osteoporosis project at [facility] in [phone]. The curve is mostly in lumbar spine, she was inelligible for study at the timedue to severe scoliosis.  She is currently followed by Dr. Saralyn Pilar with [facility] clinic cardiology for HTN and atrial flutter.   History of left knee replacement in 2001. Right knee is bothering her and making mobility more difficult. Would like to see orthopedist.   Still driving, does not drive on interstate.   HTN/Atrial Flutter: Currently taking metoprolol tartrae 50 mg BID and HCTZ 12.5 mg daily.   CMP Latest Ref Rng & Units 03/11/2018 08/20/2016 07/15/2015  Glucose 65 - 99 mg/dL 86 111(H) 120(H)  BUN 8 - 27 mg/dL 19 12 18   Creatinine 0.57 - 1.00 mg/dL 0.84 0.79 0.72  Sodium 134 - 144 mmol/L 138 141 139  Potassium 3.5 - 5.2 mmol/L 4.0 4.3 4.3  Chloride 96 - 106 mmol/L 98 100 99  CO2 20 - 29 mmol/L 25 23 25   Calcium 8.7 - 10.3 mg/dL 9.7 8.8 8.9  Total Protein 6.0 - 8.5 g/dL 6.5 7.2 6.7  Total Bilirubin 0.0 - 1.2 mg/dL 0.3 0.4 0.3  Alkaline Phos 39 - 117 IU/L 88 90 79  AST 0 -  40 IU/L 17 19 18   ALT 0 - 32 IU/L 15 17 14      HLD: Taking crestor 5 mg daily.  Lipid Panel     Component Value Date/Time   CHOL 171 03/11/2018 1517   TRIG 171 (H) 03/11/2018 1517   HDL 62 03/11/2018 1517   CHOLHDL 2.8 03/11/2018 1517   LDLCALC 75 03/11/2018 1517   LABVLDL 34 03/11/2018 1517     Osteoporosis: Fosamax 70 mg daily -----------------------------------------------------------   Review of Systems  Constitutional: Positive for fatigue.  HENT: Positive for hearing loss.   Eyes: Negative.   Respiratory: Negative.   Cardiovascular: Negative.   Gastrointestinal: Negative.   Endocrine: Negative.   Genitourinary: Negative.   Musculoskeletal: Positive for arthralgias, back pain, gait problem and joint swelling.  Skin: Negative.   Allergic/Immunologic: Negative.   Hematological: Bruises/bleeds easily.  Psychiatric/Behavioral: Negative.     Social History   Socioeconomic History  . Marital status: Widowed    Spouse name: Not on file  . Number of children: 2  . Years of education: College  . Highest education level: Bachelor's degree (e.g., BA, AB, BS)  Occupational History  . Occupation: Retired  Scientific laboratory technician  . Financial resource strain: Not hard at all  . Food insecurity    Worry:  Never true    Inability: Never true  . Transportation needs    Medical: No    Non-medical: No  Tobacco Use  . Smoking status: Former Research scientist (life sciences)  . Smokeless tobacco: Never Used  . Tobacco comment: social smoker - quit 60+ years ago  Substance and Sexual Activity  . Alcohol use: No  . Drug use: No  . Sexual activity: Not on file  Lifestyle  . Physical activity    Days per week: Not on file    Minutes per session: Not on file  . Stress: Not at all  Relationships  . Social Herbalist on phone: Not on file    Gets together: Not on file    Attends religious service: Not on file    Active member of club or organization: Not on file    Attends meetings of clubs  or organizations: Not on file    Relationship status: Not on file  . Intimate partner violence    Fear of current or ex partner: Not on file    Emotionally abused: Not on file    Physically abused: Not on file    Forced sexual activity: Not on file  Other Topics Concern  . Not on file  Social History Narrative  . Not on file    Past Medical History:  Diagnosis Date  . Hyperlipidemia   . Hypertension      Patient Active Problem List   Diagnosis Date Noted  . Prediabetes 03/11/2018  . Degeneration of intervertebral disc at C4-C5 level 06/21/2017  . Absolute anemia 07/12/2015  . Atrial flutter ([place]) 07/12/2015  . Bleeding ulcer 07/12/2015  . Acid reflux 07/12/2015  . H/O: pneumonia 07/12/2015  . History of urinary anomaly 07/12/2015  . Blood glucose elevated 07/12/2015  . BP (high blood pressure) 07/12/2015  . Scoliosis 07/12/2015  . Arthritis, degenerative 07/12/2015  . OP (osteoporosis) 07/12/2015  . Cervical pain 07/12/2015  . Avitaminosis D 07/12/2015  . Microcytic anemia 07/12/2015  . Hyperlipemia 05/30/2015  . Breathlessness on exertion 06/01/2014    Past Surgical History:  Procedure Laterality Date  . BREAST SURGERY Right 08/2002   Ductal Hyperplasia  . COSMETIC SURGERY  1993  . JOINT REPLACEMENT Left    Left knee replacement.  Marland Kitchen KNEE SURGERY Left 2002    Her family history includes Cancer in her paternal grandfather and paternal uncle; Dementia in her mother; Lung cancer in her father; Melanoma in her cousin; Transient ischemic attack in her mother.   Current Outpatient Medications:  .  alendronate (FOSAMAX) 70 MG tablet, Take 70 mg by mouth once a week. Take with a full glass of water on an empty stomach., Disp: , Rfl:  .  hydrochlorothiazide (MICROZIDE) 12.5 MG capsule, TAKE ONE CAPSULE BY MOUTH DAILY, Disp: 90 capsule, Rfl: 1 .  metoprolol tartrate (LOPRESSOR) 50 MG tablet, TAKE ONE TABLET TWICE DAILY, Disp: 180 tablet, Rfl: 1 .  rosuvastatin  (CRESTOR) 5 MG tablet, TAKE ONE TABLET EVERY DAY, Disp: 90 tablet, Rfl: 1  Patient Care Team: Paulene Floor as PCP - General (Physician Assistant) Isaias Cowman, MD as Consulting Physician (Cardiology)    Objective:    Vitals: BP 130/85 (BP Location: Left Arm, Patient Position: Sitting, Cuff Size: Normal)   Pulse 94   Temp (!) 96.9 F (36.1 C) (Temporal)   Resp 16   Ht 5\' 4"  (1.626 m)   Wt 183 lb (83 kg)   BMI 31.41 kg/m  Physical Exam Constitutional:      Appearance: Normal appearance.  Cardiovascular:     Rate and Rhythm: Normal rate and regular rhythm.     Heart sounds: Normal heart sounds.  Pulmonary:     Effort: Pulmonary effort is normal.     Breath sounds: Normal breath sounds.  Abdominal:     General: Bowel sounds are normal.     Palpations: Abdomen is soft.  Musculoskeletal:     Comments: Grossly visible curve in lumbar spine.   Skin:    General: Skin is warm and dry.  Neurological:     Mental Status: She is alert and oriented to person, place, and time. Mental status is at baseline.  Psychiatric:        Mood and Affect: Mood normal.        Behavior: Behavior normal.     Activities of Daily Living In your present state of health, do you have any difficulty performing the following activities: 07/30/2019  Hearing? Y  Vision? N  Difficulty concentrating or making decisions? N  Walking or climbing stairs? Y  Dressing or bathing? N  Doing errands, shopping? Y  Some recent data might be hidden    Fall Risk Assessment Fall Risk  07/30/2019 03/11/2018 07/18/2016 07/12/2015  Falls in the past year? 1 No No Yes  Number falls in past yr: 0 - - 1  Injury with Fall? 0 - - No     Depression Screen PHQ 2/9 Scores 07/30/2019 03/11/2018 07/18/2016 07/12/2015  PHQ - 2 Score 0 0 0 0  PHQ- 9 Score 3 - - -    6CIT Screen 07/30/2019  What Year? 0 points  What month? 0 points  What time? 0 points  Count back from 20 0 points  Months in reverse 0  points  Repeat phrase 0 points  Total Score 0      Assessment & Plan:     Annual Wellness Visit  Reviewed patient's Family Medical History Reviewed and updated list of patient's medical providers Assessment of cognitive impairment was done Assessed patient's functional ability Established a written schedule for health screening [place] Completed and Reviewed  Exercise Activities and Dietary recommendations Goals    . DIET - REDUCE PORTION SIZE     Recommend to cut portions in half and to eat 3 small meals a day with two healthy snacks in between.       Immunization History  Administered Date(s) Administered  . Influenza Split 09/16/2009, 09/05/2011  . Influenza, High Dose Seasonal PF 07/12/2015, 07/18/2016, 07/26/2017, 08/09/2018  . Pneumococcal Conjugate-13 07/12/2015  . Pneumococcal Polysaccharide-23 08/30/1998    Health Maintenance  Topic Date Due  . TETANUS/TDAP  12[birth year]/1947  . INFLUENZA VACCINE  05/30/2019  . DEXA SCAN  Completed  . PNA vac Low Risk Adult  Completed     Discussed health benefits of physical activity, and encouraged her to engage in regular exercise appropriate for her age and condition.    1. Annual physical exam   2. Atrial flutter, unspecified type ([place])   3. Need for influenza vaccination  - Flu Vaccine QUAD High Dose(Fluad)  4. Prediabetes  - HgB A1c  5. Hyperlipidemia, unspecified hyperlipidemia type  - Lipid Profile  6. Essential hypertension  - Comprehensive Metabolic Panel (CMET)  7. Chronic pain of right knee  - Ambulatory referral to Orthopedics  8. Osteoporosis, unspecified osteoporosis type, unspecified pathological fracture presence  - alendronate (FOSAMAX) 70 MG tablet; Take 1 tablet (  70 mg total) by mouth once a week. Take with a full glass of water on an empty stomach.  Dispense: 12 tablet; Refill:  3  ------------------------------------------------------------------------------------------------------------    Trinna Post, PA-C  [facility]

## 2019-07-31 ENCOUNTER — Telehealth: Payer: Self-pay

## 2019-07-31 LAB — COMPREHENSIVE METABOLIC PANEL
ALT: 12 IU/L (ref 0–32)
AST: 18 IU/L (ref 0–40)
Albumin/Globulin Ratio: 1.5 (ref 1.2–2.2)
Albumin: 4.1 g/dL (ref 3.5–4.6)
Alkaline Phosphatase: 83 IU/L (ref 39–117)
BUN/Creatinine Ratio: 21 (ref 12–28)
BUN: 18 mg/dL (ref 10–36)
Bilirubin Total: 0.5 mg/dL (ref 0.0–1.2)
CO2: 26 mmol/L (ref 20–29)
Calcium: 9.1 mg/dL (ref 8.7–10.3)
Chloride: 101 mmol/L (ref 96–106)
Creatinine, Ser: 0.84 mg/dL (ref 0.57–1.00)
GFR calc Af Amer: 71 mL/min/{1.73_m2} (ref >59)
GFR calc non Af Amer: 61 mL/min/{1.73_m2} (ref >59)
Globulin, Total: 2.7 g/dL (ref 1.5–4.5)
Glucose: 111 mg/dL — ABNORMAL HIGH (ref 65–99)
Potassium: 4 mmol/L (ref 3.5–5.2)
Sodium: 139 mmol/L (ref 134–144)
Total Protein: 6.8 g/dL (ref 6.0–8.5)

## 2019-07-31 LAB — HEMOGLOBIN A1C
Est. average glucose Bld gHb Est-mCnc: 128 mg/dL
Hgb A1c MFr Bld: 6.1 % — ABNORMAL HIGH (ref 4.8–5.6)

## 2019-07-31 LAB — LIPID PANEL
Chol/HDL Ratio: 2.4 ratio (ref 0.0–4.4)
Cholesterol, Total: 172 mg/dL (ref 100–199)
HDL: 71 mg/dL (ref >39)
LDL Chol Calc (NIH): 81 mg/dL (ref 0–99)
Triglycerides: 117 mg/dL (ref 0–149)
VLDL Cholesterol Cal: 20 mg/dL (ref 5–40)

## 2019-07-31 NOTE — Telephone Encounter (Signed)
Pt advised.   Thanks,   -Mohmed Farver  

## 2019-07-31 NOTE — Telephone Encounter (Signed)
-----   Message from Trinna Post, [place] sent at 07/31/2019  8:23 AM EDT ----- Labs stable. Sugar still prediabetic but wouldn't recommend treatment at this time. h

## 2019-08-03 ENCOUNTER — Other Ambulatory Visit: Payer: Self-pay | Admitting: Physician Assistant

## 2019-11-03 DIAGNOSIS — R600 Localized edema: Secondary | ICD-10-CM | POA: Insufficient documentation

## 2019-11-06 ENCOUNTER — Other Ambulatory Visit: Payer: Self-pay | Admitting: Physician Assistant

## 2019-11-06 DIAGNOSIS — E785 Hyperlipidemia, unspecified: Secondary | ICD-10-CM

## 2020-02-02 ENCOUNTER — Other Ambulatory Visit: Payer: Self-pay | Admitting: Physician Assistant

## 2020-02-02 NOTE — Telephone Encounter (Signed)
Courtesy refill given  Requested Prescriptions  Pending Prescriptions Disp Refills  . metoprolol tartrate (LOPRESSOR) 50 MG tablet [Pharmacy Med Name: METOPROLOL TARTRATE 50 MG TAB] 180 tablet 0    Sig: TAKE ONE TABLET TWICE DAILY     Cardiovascular:  Beta Blockers Failed - 02/02/2020  1:59 PM      Failed - Valid encounter within last 6 months    Recent Outpatient Visits          6 months ago Annual physical exam   [facility] Carles Collet M, PA-C   1 year ago Pressure injury of skin of contiguous region involving left buttock and hip, unspecified injury stage   [place], Wendee Beavers, PA-C   1 year ago Annual physical exam   [facility] Trinna Post, [place]   1 year ago [place] Carles Collet M, [place]   1 year ago [place], [place], [place]             Passed - Last BP in normal range    BP Readings from Last 1 Encounters:  07/30/19 130/85         Passed - Last Heart Rate in normal range    Pulse Readings from Last 1 Encounters:  07/30/19 94

## 2020-02-12 ENCOUNTER — Telehealth: Payer: Self-pay

## 2020-02-12 NOTE — Telephone Encounter (Signed)
Copied from [place] [phone]. Topic: Referral - Request for Referral >> Feb 11, 2020  4:26 PM Rainey Pines A wrote: Has patient seen PCP for this complaint? Yes *If NO, is insurance requiring patient see PCP for this issue before PCP can refer them? Referral for which specialty: Physical Therapy Preferred provider/office: [facility]  Reason for referral: Patient uses walker and the assisted living facility is recommending PT but a referral is required.

## 2020-02-12 NOTE — Telephone Encounter (Signed)
Called and LVM for patient's daughter [id number] [phone] to give our office a call back to answer the question below.

## 2020-02-12 NOTE — Telephone Encounter (Signed)
Pt returned office call. Pt says that she's not sure of which is needed. Pt says that Zeb Comfort at [phone] at the facility is the contact person.    Please assist

## 2020-02-12 NOTE — Telephone Encounter (Signed)
Does she need a hard script faxed to the center or an order placed for home PT?

## 2020-02-15 NOTE — Telephone Encounter (Signed)
Order written on hard script and ready to be faxed.

## 2020-02-15 NOTE — Telephone Encounter (Signed)
Order was faxed to [facility] w/ [place].

## 2020-02-15 NOTE — Telephone Encounter (Signed)
Aaron Edelman w/ [place] returned call and states that the order for PT will have to be faxed to him @ [phone]. FYI

## 2020-05-04 ENCOUNTER — Other Ambulatory Visit: Payer: Self-pay | Admitting: Physician Assistant

## 2020-05-04 NOTE — Telephone Encounter (Signed)
Appt scheduled 08/02/20. Refill provided until scheduled appt.

## 2020-08-01 ENCOUNTER — Telehealth: Payer: Self-pay

## 2020-08-01 NOTE — Telephone Encounter (Signed)
Pt is asymptomatic. She will come in tomorrow to be seen tomorrow.

## 2020-08-01 NOTE — Telephone Encounter (Signed)
Pt has an appt tomorrow. Please advise. Thanks!

## 2020-08-01 NOTE — Telephone Encounter (Signed)
Copied from [place] [phone]. Topic: General - Other >> Aug 01, 2020 10:38 AM Antonieta Iba C wrote: Reason for CRM: pt called in for assistance. Pt says that she was exposed to someone that tested positive for covid on 9/25, pt says that she isn't having any symptoms. Pt would like to be advised, should she be tested? Should she keep this apt?    CB: [phone] --- please advise

## 2020-08-01 NOTE — Telephone Encounter (Signed)
She was exposed on the 25 or the person tested positive on the 25 and she was exposed earlier? If she is concerned she has COVID, we can certainly order her a test or she may get tested at her local pharmacy and she can reschedule her visit. But that exposure was far enough back that she is probably OK to come in as long as she is not having symptoms.

## 2020-08-02 ENCOUNTER — Ambulatory Visit (INDEPENDENT_AMBULATORY_CARE_PROVIDER_SITE_OTHER): Payer: Medicare HMO | Admitting: Physician Assistant

## 2020-08-02 ENCOUNTER — Encounter: Payer: Self-pay | Admitting: Physician Assistant

## 2020-08-02 ENCOUNTER — Other Ambulatory Visit: Payer: Self-pay

## 2020-08-02 VITALS — BP 121/69 | HR 71 | Temp 98.1°F | Resp 20 | Ht 64.0 in | Wt 186.6 lb

## 2020-08-02 DIAGNOSIS — D509 Iron deficiency anemia, unspecified: Secondary | ICD-10-CM

## 2020-08-02 DIAGNOSIS — Z0000 Encounter for general adult medical examination without abnormal findings: Secondary | ICD-10-CM | POA: Diagnosis not present

## 2020-08-02 DIAGNOSIS — R7303 Prediabetes: Secondary | ICD-10-CM | POA: Diagnosis not present

## 2020-08-02 DIAGNOSIS — N3090 Cystitis, unspecified without hematuria: Secondary | ICD-10-CM

## 2020-08-02 DIAGNOSIS — M810 Age-related osteoporosis without current pathological fracture: Secondary | ICD-10-CM

## 2020-08-02 DIAGNOSIS — E785 Hyperlipidemia, unspecified: Secondary | ICD-10-CM | POA: Diagnosis not present

## 2020-08-02 DIAGNOSIS — Z23 Encounter for immunization: Secondary | ICD-10-CM | POA: Diagnosis not present

## 2020-08-02 DIAGNOSIS — R350 Frequency of micturition: Secondary | ICD-10-CM | POA: Diagnosis not present

## 2020-08-02 DIAGNOSIS — D649 Anemia, unspecified: Secondary | ICD-10-CM

## 2020-08-02 LAB — POCT URINALYSIS DIPSTICK
Bilirubin, UA: NEGATIVE
Glucose, UA: NEGATIVE
Ketones, UA: NEGATIVE
Nitrite, UA: NEGATIVE
Protein, UA: NEGATIVE
Spec Grav, UA: 1.025 (ref 1.010–1.025)
Urobilinogen, UA: 0.2 E.U./dL
pH, UA: 6.5 (ref 5.0–8.0)

## 2020-08-02 NOTE — Progress Notes (Signed)
Annual Wellness Visit     Patient: Natalie Lucas, Female    DOB: 03/03/1927, 86 y.o.   MRN: [id number] Visit Date: 08/02/2020  Today's Provider: Trinna Post, PA-C   Chief Complaint  Patient presents with  . Annual Exam   Subjective    Natalie Lucas is a 86 y.o. female who presents today for her Annual Wellness Visit. She reports consuming a general diet. The patient does not participate in regular exercise at present. She generally feels well. She reports sleeping well. She has additional problems to discuss today.   She is going to the pool twice per week.   She is having urinary frequency, denies hematuria. Denies fevers, chills, nausea, vomiting, flank pain.   Osteoporosis: DEXA 2019 showed osteoporosis with T score ranging from -2.3 to -4.0. Patient has been on Fosamax intermittently. She does not want to redo a DEXA scan. She does not want to pursue alternative treatment for osteoporosis.   She has had both doses of COVID vaccine, 2nd dose 01/2020.   In the interim, she has noticed more exertional fatigue. She was evaluated by her cardiologist 04/19/2020 and underwent echo which was largely normal. Cardiologist impression was cardiac etiology unlikely. She was advised it was age related.      Medications: Outpatient Medications Prior to Visit  Medication Sig  . hydrochlorothiazide (MICROZIDE) 12.5 MG capsule TAKE ONE CAPSULE BY MOUTH DAILY  . metoprolol tartrate (LOPRESSOR) 50 MG tablet TAKE ONE TABLET TWICE DAILY  . rosuvastatin (CRESTOR) 5 MG tablet TAKE ONE TABLET BY MOUTH EVERY DAY   No facility-administered medications prior to visit.    Allergies  Allergen Reactions  . Ace Inhibitors Cough  . Aspirin     Bleeding ulcers.  . Salicylates     Other reaction(s): Unknown    Patient Care Team: Paulene Floor as PCP - General (Physician Assistant) Isaias Cowman, MD as Consulting Physician (Cardiology)  Review of Systems  Constitutional:  Negative.   HENT: Negative.   Eyes: Negative.   Respiratory: Negative.   Cardiovascular: Negative.   Gastrointestinal: Negative.   Endocrine: Negative.   Genitourinary: Negative.   Musculoskeletal: Negative.   Allergic/Immunologic: Negative.   Neurological: Negative.   Hematological: Negative.   Psychiatric/Behavioral: Negative.       Objective    Vitals: BP 121/69   Pulse 71   Temp 98.1 F (36.7 C)   Resp 20   Ht 5\' 4"  (1.626 m)   Wt 186 lb 9.6 oz (84.6 kg)   BMI 32.03 kg/m    Physical Exam Constitutional:      Appearance: Normal appearance.  Cardiovascular:     Rate and Rhythm: Normal rate and regular rhythm.     Heart sounds: Normal heart sounds.  Pulmonary:     Effort: Pulmonary effort is normal.     Breath sounds: Normal breath sounds.  Abdominal:     General: Bowel sounds are normal.     Palpations: Abdomen is soft.  Skin:    General: Skin is warm and dry.  Neurological:     Mental Status: She is alert and oriented to person, place, and time. Mental status is at baseline.  Psychiatric:        Mood and Affect: Mood normal.        Behavior: Behavior normal.      Most recent functional status assessment: In your present state of health, do you have any difficulty performing the following activities: 08/02/2020  Hearing? Y  Vision? Y  Difficulty concentrating or making decisions? Y  Walking or climbing stairs? Y  Dressing or bathing? Y  Doing errands, shopping? N  Some recent data might be hidden   Most recent fall risk assessment: Fall Risk  08/02/2020  Falls in the past year? 0  Number falls in past yr: 0  Injury with Fall? 0  Risk for fall due to : Impaired balance/gait  Follow up Falls evaluation completed    Most recent depression screenings: PHQ 2/9 Scores 08/02/2020 07/30/2019  PHQ - 2 Score 0 0  PHQ- 9 Score - 3   Most recent cognitive screening: 6CIT Screen 07/30/2019  What Year? 0 points  What month? 0 points  What time? 0 points    Count back from 20 0 points  Months in reverse 0 points  Repeat phrase 0 points  Total Score 0   Most recent Audit-C alcohol use screening Alcohol Use Disorder Test (AUDIT) 08/02/2020  1. How often do you have a drink containing alcohol? 0  2. How many drinks containing alcohol do you have on a typical day when you are drinking? 0  3. How often do you have six or more drinks on one occasion? 0  AUDIT-C Score 0  Alcohol Brief Interventions/Follow-up AUDIT Score <7 follow-up not indicated   A score of 3 or more in women, and 4 or more in men indicates increased risk for alcohol abuse, EXCEPT if all of the points are from question 1   No results found for any visits on 08/02/20.  Assessment & Plan     Annual wellness visit done today including the all of the following: Reviewed patient's Family Medical History Reviewed and updated list of patient's medical providers Assessment of cognitive impairment was done Assessed patient's functional ability Established a written schedule for health screening [place] Completed and Reviewed  Exercise Activities and Dietary recommendations Goals    . DIET - REDUCE PORTION SIZE     Recommend to cut portions in half and to eat 3 small meals a day with two healthy snacks in between.       Immunization History  Administered Date(s) Administered  . Fluad Quad(high Dose 65+) 07/30/2019, 08/02/2020  . Influenza Split 09/16/2009, 09/05/2011  . Influenza, High Dose Seasonal PF 07/12/2015, 07/18/2016, 07/26/2017, 08/09/2018  . Pneumococcal Conjugate-13 07/12/2015  . Pneumococcal Polysaccharide-23 08/30/1998    Health Maintenance  Topic Date Due  . COVID-19 Vaccine (1) Never done  . TETANUS/TDAP  08/02/2021 (Originally 12/03/1945)  . INFLUENZA VACCINE  Completed  . DEXA SCAN  Completed  . PNA vac Low Risk Adult  Completed     Discussed health benefits of physical activity, and encouraged her to engage in regular  exercise appropriate for her age and condition.   1. Annual physical exam  UTD on maintenance. Patient has appointed POA, has living will and has signed DNR forms in her facility.   2. Osteoporosis, unspecified osteoporosis type, unspecified pathological fracture presence  Significant osteoporosis. At this time, patient does not wish to repeat DEXA or pursue treatment of this. Counseled on increased risks of fractures.   3. Prediabetes  - HgB A1c  4. Hyperlipidemia, unspecified hyperlipidemia type  - Comprehensive Metabolic Panel (CMET) - Lipid Profile  5. Anemia, unspecified type  Will check CBC and labs as above for further investigate faitgue.   - CBC with Differential  6. Urinary frequency  May continue AZO for now, will evaluate for  UTI.   - POCT Urinalysis Dipstick - CULTURE, URINE COMPREHENSIVE  7. Need for influenza vaccination  Counseled patient she can get her COVID booster 2 weeks after her flu shot. She reports her facility is going to offer the booster and she plans to get it there.   - Flu Vaccine QUAD High Dose(Fluad)    Return in about 1 year (around 08/02/2021) for cpe.     ITrinna Post, PA-C, have reviewed all documentation for this visit. The documentation on 08/02/20 for the exam, diagnosis, procedures, and orders are all accurate and complete.  The entirety of the information documented in the History of Present Illness, Review of Systems and Physical Exam were personally obtained by me. Portions of this information were initially documented by [facility] and reviewed by me for thoroughness and accuracy.     Paulene Floor  [facility] [phone] (phone) [phone] (fax)  [place]

## 2020-08-02 NOTE — Patient Instructions (Signed)
Health Maintenance, Female Adopting a healthy lifestyle and getting preventive care are important in promoting health and wellness. Ask your health care provider about:  The right schedule for you to have regular tests and exams.  Things you can do on your own to prevent diseases and keep yourself healthy. What should I know about diet, weight, and exercise? Eat a healthy diet   Eat a diet that includes plenty of vegetables, fruits, low-fat dairy products, and lean protein.  Do not eat a lot of foods that are high in solid fats, added sugars, or sodium. Maintain a healthy weight Body mass index (BMI) is used to identify weight problems. It estimates body fat based on height and weight. Your health care provider can help determine your BMI and help you achieve or maintain a healthy weight. Get regular exercise Get regular exercise. This is one of the most important things you can do for your health. Most adults should:  Exercise for at least 150 minutes each week. The exercise should increase your heart rate and make you sweat (moderate-intensity exercise).  Do strengthening exercises at least twice a week. This is in addition to the moderate-intensity exercise.  Spend less time sitting. Even light physical activity can be beneficial. Watch cholesterol and blood lipids Have your blood tested for lipids and cholesterol at 84 years of age, then have this test every 5 years. Have your cholesterol levels checked more often if:  Your lipid or cholesterol levels are high.  You are older than 84 years of age.  You are at high risk for heart disease. What should I know about cancer screening? Depending on your health history and family history, you may need to have cancer screening at various ages. This may include screening for:  Breast cancer.  Cervical cancer.  Colorectal cancer.  Skin cancer.  Lung cancer. What should I know about heart disease, diabetes, and high blood  pressure? Blood pressure and heart disease  High blood pressure causes heart disease and increases the risk of stroke. This is more likely to develop in people who have high blood pressure readings, are of African descent, or are overweight.  Have your blood pressure checked: ? Every 3-5 years if you are 18-39 years of age. ? Every year if you are 40 years old or older. Diabetes Have regular diabetes screenings. This checks your fasting blood sugar level. Have the screening done:  Once every three years after age 40 if you are at a normal weight and have a low risk for diabetes.  More often and at a younger age if you are overweight or have a high risk for diabetes. What should I know about preventing infection? Hepatitis B If you have a higher risk for hepatitis B, you should be screened for this virus. Talk with your health care provider to find out if you are at risk for hepatitis B infection. Hepatitis C Testing is recommended for:  Everyone born from 1945 through 1965.  Anyone with known risk factors for hepatitis C. Sexually transmitted infections (STIs)  Get screened for STIs, including gonorrhea and chlamydia, if: ? You are sexually active and are younger than 84 years of age. ? You are older than 84 years of age and your health care provider tells you that you are at risk for this type of infection. ? Your sexual activity has changed since you were last screened, and you are at increased risk for chlamydia or gonorrhea. Ask your health care provider if   you are at risk.  Ask your health care provider about whether you are at high risk for HIV. Your health care provider may recommend a prescription medicine to help prevent HIV infection. If you choose to take medicine to prevent HIV, you should first get tested for HIV. You should then be tested every 3 months for as long as you are taking the medicine. Pregnancy  If you are about to stop having your period (premenopausal) and  you may become pregnant, seek counseling before you get pregnant.  Take 400 to 800 micrograms (mcg) of folic acid every day if you become pregnant.  Ask for birth control (contraception) if you want to prevent pregnancy. Osteoporosis and menopause Osteoporosis is a disease in which the bones lose minerals and strength with aging. This can result in bone fractures. If you are 65 years old or older, or if you are at risk for osteoporosis and fractures, ask your health care provider if you should:  Be screened for bone loss.  Take a calcium or vitamin D supplement to lower your risk of fractures.  Be given hormone replacement therapy (HRT) to treat symptoms of menopause. Follow these instructions at home: Lifestyle  Do not use any products that contain nicotine or tobacco, such as cigarettes, e-cigarettes, and chewing tobacco. If you need help quitting, ask your health care provider.  Do not use street drugs.  Do not share needles.  Ask your health care provider for help if you need support or information about quitting drugs. Alcohol use  Do not drink alcohol if: ? Your health care provider tells you not to drink. ? You are pregnant, may be pregnant, or are planning to become pregnant.  If you drink alcohol: ? Limit how much you use to 0-1 drink a day. ? Limit intake if you are breastfeeding.  Be aware of how much alcohol is in your drink. In the [place] one drink equals one 12 oz bottle of beer (355 mL), one 5 oz glass of wine (148 mL), or one 1 oz glass of hard liquor (44 mL). General instructions  Schedule regular health, dental, and eye exams.  Stay current with your vaccines.  Tell your health care provider if: ? You often feel depressed. ? You have ever been abused or do not feel safe at home. Summary  Adopting a healthy lifestyle and getting preventive care are important in promoting health and wellness.  Follow your health care provider's instructions about healthy  diet, exercising, and getting tested or screened for diseases.  Follow your health care provider's instructions on monitoring your cholesterol and blood pressure. This information is not intended to replace advice given to you by your health care provider. Make sure you discuss any questions you have with your health care provider. Document Revised: 10/08/2018 Document Reviewed: 10/08/2018 Elsevier Patient Education  2020 Elsevier Inc.  

## 2020-08-03 ENCOUNTER — Telehealth: Payer: Self-pay | Admitting: Physician Assistant

## 2020-08-03 LAB — CBC WITH DIFFERENTIAL/PLATELET
Basophils Absolute: 0.1 10*3/uL (ref 0.0–0.2)
Basos: 1 %
EOS (ABSOLUTE): 0.2 10*3/uL (ref 0.0–0.4)
Eos: 3 %
Hematocrit: 31 % — ABNORMAL LOW (ref 34.0–46.6)
Hemoglobin: 9.6 g/dL — ABNORMAL LOW (ref 11.1–15.9)
Immature Grans (Abs): 0 10*3/uL (ref 0.0–0.1)
Immature Granulocytes: 0 %
Lymphocytes Absolute: 1.3 10*3/uL (ref 0.7–3.1)
Lymphs: 24 %
MCH: 24.8 pg — ABNORMAL LOW (ref 26.6–33.0)
MCHC: 31 g/dL — ABNORMAL LOW (ref 31.5–35.7)
MCV: 80 fL (ref 79–97)
Monocytes Absolute: 0.6 10*3/uL (ref 0.1–0.9)
Monocytes: 11 %
Neutrophils Absolute: 3.4 10*3/uL (ref 1.4–7.0)
Neutrophils: 61 %
Platelets: 301 10*3/uL (ref 150–450)
RBC: 3.87 x10E6/uL (ref 3.77–5.28)
RDW: 17.7 % — ABNORMAL HIGH (ref 11.7–15.4)
WBC: 5.6 10*3/uL (ref 3.4–10.8)

## 2020-08-03 LAB — COMPREHENSIVE METABOLIC PANEL
ALT: 10 IU/L (ref 0–32)
AST: 16 IU/L (ref 0–40)
Albumin/Globulin Ratio: 1.3 (ref 1.2–2.2)
Albumin: 3.8 g/dL (ref 3.5–4.6)
Alkaline Phosphatase: 86 IU/L (ref 44–121)
BUN/Creatinine Ratio: 18 (ref 12–28)
BUN: 16 mg/dL (ref 10–36)
Bilirubin Total: 0.2 mg/dL (ref 0.0–1.2)
CO2: 25 mmol/L (ref 20–29)
Calcium: 9.3 mg/dL (ref 8.7–10.3)
Chloride: 99 mmol/L (ref 96–106)
Creatinine, Ser: 0.88 mg/dL (ref 0.57–1.00)
GFR calc Af Amer: 66 mL/min/{1.73_m2} (ref >59)
GFR calc non Af Amer: 58 mL/min/{1.73_m2} — ABNORMAL LOW (ref >59)
Globulin, Total: 3 g/dL (ref 1.5–4.5)
Glucose: 145 mg/dL — ABNORMAL HIGH (ref 65–99)
Potassium: 4 mmol/L (ref 3.5–5.2)
Sodium: 138 mmol/L (ref 134–144)
Total Protein: 6.8 g/dL (ref 6.0–8.5)

## 2020-08-03 LAB — HEMOGLOBIN A1C
Est. average glucose Bld gHb Est-mCnc: 134 mg/dL
Hgb A1c MFr Bld: 6.3 % — ABNORMAL HIGH (ref 4.8–5.6)

## 2020-08-03 LAB — LIPID PANEL
Chol/HDL Ratio: 2.7 ratio (ref 0.0–4.4)
Cholesterol, Total: 164 mg/dL (ref 100–199)
HDL: 60 mg/dL (ref >39)
LDL Chol Calc (NIH): 77 mg/dL (ref 0–99)
Triglycerides: 160 mg/dL — ABNORMAL HIGH (ref 0–149)
VLDL Cholesterol Cal: 27 mg/dL (ref 5–40)

## 2020-08-03 NOTE — Telephone Encounter (Signed)
Lab test added

## 2020-08-03 NOTE — Telephone Encounter (Signed)
Can we please add Fe+ TIBC + Fer under anemia? Thank you.

## 2020-08-04 ENCOUNTER — Other Ambulatory Visit: Payer: Self-pay | Admitting: Physician Assistant

## 2020-08-05 LAB — IRON AND TIBC
Iron Saturation: 26 % (ref 15–55)
Iron: 89 ug/dL (ref 27–139)
Total Iron Binding Capacity: 346 ug/dL (ref 250–450)
UIBC: 257 ug/dL (ref 118–369)

## 2020-08-05 LAB — FERRITIN: Ferritin: 10 ng/mL — ABNORMAL LOW (ref 15–150)

## 2020-08-05 LAB — SPECIMEN STATUS REPORT

## 2020-08-05 MED ORDER — FERROUS SULFATE 324 (65 FE) MG PO TBEC: DELAYED_RELEASE_TABLET | ORAL | 2 refills | Status: DC

## 2020-08-05 NOTE — Addendum Note (Signed)
Addended by: Trinna Post on: 08/05/2020 01:26 PM   Modules accepted: Orders

## 2020-08-07 LAB — CULTURE, URINE COMPREHENSIVE

## 2020-08-09 MED ORDER — SULFAMETHOXAZOLE-TRIMETHOPRIM 800-160 MG PO TABS: 1.0000 | ORAL_TABLET | Freq: Two times a day (BID) | ORAL | 0 refills | Status: AC

## 2020-08-09 NOTE — Addendum Note (Signed)
Addended by: Trinna Post on: 08/09/2020 10:10 AM   Modules accepted: Orders

## 2020-09-19 ENCOUNTER — Telehealth: Payer: Self-pay | Admitting: Physician Assistant

## 2020-09-19 DIAGNOSIS — D509 Iron deficiency anemia, unspecified: Secondary | ICD-10-CM

## 2020-09-19 NOTE — Telephone Encounter (Signed)
I have placed the orders. However, I did want to let patient know that even if her iron goes up from the supplementation and the anemia improves, she may still have an unaddressed bleed. If she does not want to further work that up and is OK with her levels, that is certainly her decision.

## 2020-09-19 NOTE — Telephone Encounter (Signed)
Please advise 

## 2020-09-19 NOTE — Telephone Encounter (Signed)
Patient is calling because she has an appt next week for low iron and anemia Dr. Marius Ditch. Patient has been taking iron and is requesting an order for a blood test to confirm that her iron is still low. If it is not she will not keep her appt with Dr. Marius Ditch. Please advise CB- [phone]

## 2020-09-20 NOTE — Telephone Encounter (Signed)
Left message to call back. OK for PEC to advise of below.

## 2020-09-20 NOTE — Telephone Encounter (Signed)
Patient notified of PCP recommendation- she is going to get lab- and go from there- she is advise follow up is recommended- even with normal results.

## 2020-09-21 LAB — IRON,TIBC AND FERRITIN PANEL
Ferritin: 48 ng/mL (ref 15–150)
Iron Saturation: 25 % (ref 15–55)
Iron: 66 ug/dL (ref 27–139)
Total Iron Binding Capacity: 268 ug/dL (ref 250–450)
UIBC: 202 ug/dL (ref 118–369)

## 2020-09-21 LAB — CBC WITH DIFFERENTIAL
Basophils Absolute: 0 10*3/uL (ref 0.0–0.2)
Basos: 1 %
EOS (ABSOLUTE): 0.1 10*3/uL (ref 0.0–0.4)
Eos: 2 %
Hematocrit: 35.7 % (ref 34.0–46.6)
Hemoglobin: 12 g/dL (ref 11.1–15.9)
Immature Grans (Abs): 0 10*3/uL (ref 0.0–0.1)
Immature Granulocytes: 0 %
Lymphocytes Absolute: 2 10*3/uL (ref 0.7–3.1)
Lymphs: 35 %
MCH: 29 pg (ref 26.6–33.0)
MCHC: 33.6 g/dL (ref 31.5–35.7)
MCV: 86 fL (ref 79–97)
Monocytes Absolute: 0.5 10*3/uL (ref 0.1–0.9)
Monocytes: 9 %
Neutrophils Absolute: 3.1 10*3/uL (ref 1.4–7.0)
Neutrophils: 53 %
RBC: 4.14 x10E6/uL (ref 3.77–5.28)
RDW: 19.6 % — ABNORMAL HIGH (ref 11.7–15.4)
WBC: 5.8 10*3/uL (ref 3.4–10.8)

## 2020-09-26 ENCOUNTER — Telehealth: Payer: Self-pay

## 2020-09-26 ENCOUNTER — Ambulatory Visit (INDEPENDENT_AMBULATORY_CARE_PROVIDER_SITE_OTHER): Payer: Medicare HMO | Admitting: Gastroenterology

## 2020-09-26 ENCOUNTER — Encounter: Payer: Self-pay | Admitting: Gastroenterology

## 2020-09-26 ENCOUNTER — Other Ambulatory Visit: Payer: Self-pay

## 2020-09-26 VITALS — BP 133/84 | HR 69 | Temp 98.3°F | Ht 64.0 in | Wt 189.4 lb

## 2020-09-26 DIAGNOSIS — D649 Anemia, unspecified: Secondary | ICD-10-CM

## 2020-09-26 DIAGNOSIS — D509 Iron deficiency anemia, unspecified: Secondary | ICD-10-CM

## 2020-09-26 NOTE — Telephone Encounter (Signed)
Patient was advised and states that she is going to see GI doctor today,09/26/2020 and will let you know if anything changed. Just a [place]

## 2020-09-26 NOTE — Telephone Encounter (Signed)
-----   Message from Trinna Post, [place] sent at 09/21/2020  8:08 AM EST ----- Anemia looks much better after taking iron. The patient can decide if she wishes to follow up with GI though I have mentioned previously there could still be an unaddressed bleed. Would continue the iron supplement if she is tolerating it.

## 2020-09-26 NOTE — Telephone Encounter (Signed)
Copied from [place] [id number]. Topic: General - Other >> Sep 26, 2020 10:53 AM Rainey Pines A wrote: Patient would like a callback to go over lab results before her upcoming GI appt today. Please Arnoldo Hooker

## 2020-09-26 NOTE — Telephone Encounter (Signed)
Patient was advised via another message. 

## 2020-09-26 NOTE — Patient Instructions (Signed)
Please come to our office the first week of January for lab work. Our lab is open Mondays 1-4pm, Tuesday and Wednesday 8:30-4pm and Thursdays 1-4pm

## 2020-09-26 NOTE — Progress Notes (Signed)
Natalie Darby, MD [address]  [place]  [place], [place] [address]  Main: [phone]  Fax: [phone]    Gastroenterology Consultation  Referring Provider:     Paulene Floor Primary Care Physician:  Paulene Floor Primary Gastroenterologist:  Dr. Cephas Lucas Reason for Consultation:     Iron deficiency anemia        HPI:   Natalie Lucas is a 86 y.o. female referred by Dr. Trinna Post, PA-C  for consultation & management of iron deficiency anemia.  Patient is found to have severe iron deficiency anemia when she had routine labs for annual wellness visit.  Her hemoglobin was 9.6 on 08/02/2020, dropped from 12.8 in 02/2018.  Iron studies revealed ferritin 10, therefore she was started on oral iron 3 times a day and referred to GI for further evaluation.  Repeat hemoglobin was 12 on 09/20/2020, ferritin levels improved to 48.  She has normal B12 and folate levels.  Patient is here to discuss regarding further investigation if necessary  Patient denies any GI symptoms including melena, rectal bleeding, weight loss, abdominal distention, altered bowel habits, abdominal pain.  She denies fatigue, shortness of breath.  She lives in assisted living facility with continuing care.  She uses walker for support with limited physical activity.  Her mobility is limited due to severe arthritis of her right knee.  She is otherwise fairly healthy  NSAIDs: None  Antiplts/Anticoagulants/Anti thrombotics: None  GI Procedures: She reports undergoing colonoscopy more than 10 years ago  Past Medical History:  Diagnosis Date   Hyperlipidemia    Hypertension     Past Surgical History:  Procedure Laterality Date   BREAST SURGERY Right 08/2002   Ductal Hyperplasia   COSMETIC SURGERY  1993   JOINT REPLACEMENT Left    Left knee replacement.   KNEE SURGERY Left 2002    Current Outpatient Medications:    ferrous sulfate 324 (65 Fe) MG TBEC, Take 1 pill  three times daily., Disp: 90 tablet, Rfl: 2   hydrochlorothiazide (HYDRODIURIL) 25 MG tablet, Take 1 tablet by mouth daily., Disp: , Rfl:    metoprolol tartrate (LOPRESSOR) 50 MG tablet, TAKE ONE TABLET TWICE DAILY, Disp: 180 tablet, Rfl: 0   rosuvastatin (CRESTOR) 5 MG tablet, TAKE ONE TABLET BY MOUTH EVERY DAY, Disp: 90 tablet, Rfl: 2   Family History  Problem Relation Age of Onset   Dementia Mother    Transient ischemic attack Mother    Lung cancer Father    Cancer Paternal Uncle    Cancer Paternal Grandfather    Melanoma Cousin      Social History   Tobacco Use   Smoking status: Former Smoker   Smokeless tobacco: Never Used   Tobacco comment: social smoker - quit 60+ years ago  Vaping Use   Vaping Use: Never used  Substance Use Topics   Alcohol use: No   Drug use: No    Allergies as of 09/26/2020 - Review Complete 09/26/2020  Allergen Reaction Noted   Ace inhibitors Cough 06/30/2015   Aspirin  69/62/9528   Salicylates  41/32/4401    Review of Systems:    All systems reviewed and negative except where noted in HPI.   Physical Exam:  BP 133/84 (BP Location: Left Arm, Patient Position: Sitting, Cuff Size: Normal)    Pulse 69    Temp 98.3 F (36.8 C) (Oral)    Ht 5\' 4"  (1.626 m)  Wt 189 lb 6 oz (85.9 kg)    BMI 32.51 kg/m  No LMP recorded. Patient is postmenopausal.  General:   Alert,  Well-developed, well-nourished, pleasant and cooperative in NAD Head:  Normocephalic and atraumatic. Eyes:  Sclera clear, no icterus.   Conjunctiva pink. Ears:  Normal auditory acuity. Nose:  No deformity, discharge, or lesions. Mouth:  No deformity or lesions,oropharynx pink & moist. Neck:  Supple; no masses or thyromegaly. Lungs:  Respirations even and unlabored.  Clear throughout to auscultation.   No wheezes, crackles, or rhonchi. No acute distress. Heart:  Regular rate and rhythm; no murmurs, clicks, rubs, or gallops. Abdomen:  Normal bowel sounds. Soft,  non-tender and non-distended without masses, hepatosplenomegaly or hernias noted.  No guarding or rebound tenderness.   Rectal: Not performed Msk:  Symmetrical without gross deformities.  Pulses:  Normal pulses noted. Extremities:  No clubbing or edema.  No cyanosis. Neurologic:  Alert and oriented x3;  grossly normal neurologically. Skin:  Intact without significant lesions or rashes. No jaundice. Lymph Nodes:  No significant cervical adenopathy. Psych:  Alert and cooperative. Normal mood and affect.  Imaging Studies: No abdominal imaging  Assessment and Plan:   Natalie Lucas is a 86 y.o. female with hypertension, hyperlipidemia, left knee replacement, osteoarthritis of right knee is seen in consultation for new onset of iron deficiency anemia of unclear etiology.  No signs or symptoms to suggest GI bleed.  Iron deficiency anemia responded to oral iron replacement.  Today, I have discussed regarding evaluation with bidirectional endoscopy as a possibility.  However, patient's anemia is currently resolved with iron replacement, recommend to recheck labs in 2 to 3 months.  If her anemia recurs after discontinuation of iron replacement, we can pursue testing at that time   Follow up in 3 months   Natalie Darby, MD

## 2020-09-27 LAB — B12 AND FOLATE PANEL
Folate: >20 ng/mL (ref >3.0)
Vitamin B-12: 402 pg/mL (ref 232–1245)

## 2020-10-05 ENCOUNTER — Telehealth: Payer: Self-pay | Admitting: *Deleted

## 2020-10-05 ENCOUNTER — Telehealth: Payer: Self-pay | Admitting: Gastroenterology

## 2020-10-05 NOTE — Telephone Encounter (Signed)
Reviewed lab results and provider's note with the patient from 09/21/20. Patient will call GI for more recent lab results.

## 2020-10-05 NOTE — Telephone Encounter (Signed)
Normal B12 and folate levels  RV

## 2020-10-05 NOTE — Telephone Encounter (Signed)
Please advised about lab work

## 2020-10-05 NOTE — Telephone Encounter (Signed)
Patient wanting to know lab results from labs done on 11.29.21. Please call pt back with those results.

## 2020-10-05 NOTE — Telephone Encounter (Signed)
Patient verbalized understanding of results  

## 2020-11-02 ENCOUNTER — Other Ambulatory Visit: Payer: Self-pay | Admitting: Physician Assistant

## 2020-11-02 DIAGNOSIS — E785 Hyperlipidemia, unspecified: Secondary | ICD-10-CM

## 2020-11-04 ENCOUNTER — Telehealth: Payer: Self-pay | Admitting: Physician Assistant

## 2020-11-04 NOTE — Telephone Encounter (Signed)
Copied from [place] [id number]. Topic: Medicare AWV >> Nov 04, 2020  1:26 PM Cher Nakai R wrote: Reason for CRM:   No answer unable to leave message for patient to call back and schedule Medicare Annual Wellness Visit (AWV) in office.   If not able to come in office, please offer to do virtually.   Last AWV 03/11/2018  Please schedule at anytime with [facility] Health Advisor.  If any questions, please contact me at [phone]

## 2020-12-26 ENCOUNTER — Ambulatory Visit: Payer: Medicare HMO | Admitting: Gastroenterology

## 2021-03-30 ENCOUNTER — Ambulatory Visit: Payer: Self-pay | Admitting: *Deleted

## 2021-03-30 MED ORDER — NIRMATRELVIR/RITONAVIR (PAXLOVID) TABLET (RENAL DOSING): 2.0000 | ORAL_TABLET | Freq: Two times a day (BID) | ORAL | 0 refills | Status: AC

## 2021-03-30 NOTE — Addendum Note (Signed)
Addended by: Virginia Crews on: 03/30/2021 01:07 PM   Modules accepted: Orders

## 2021-03-30 NOTE — Telephone Encounter (Signed)
rx for renally dosed paxlovid sent to pharmacy based on last GFR.

## 2021-03-30 NOTE — Telephone Encounter (Signed)
I returned her call.   She had called in wanting advice because she tested positive for Covid this morning. Monday she started having symptoms:  Sore throat, coughing, head congestion and post nasal drip.  She is not coughing up anything but is coughing frequently while on the phone with her.   She is blowing clear mucus from her nose and sneezing a lot.  Denies any other symptoms:  Loss of taste/smell, N/V/D, body aches, fever, shortness of breath or chest discomfort.  There are 12 residents where she lives that are also positive for Covid.   The nurse there has gone over the quarantine protocol with her.  She lives in an apartment alone.  Due to her risk factors:  Age, hypertension, high cholesterol, anemia I'm sending her information to [facility] for Dr. Brita Romp to see about the antivirals or monoclonal antibody infusion treatment as an option for her.  Pt was agreeable to this.   I let her know someone would call her back and let her know.   Reason for Disposition . [1] HIGH RISK for severe COVID complications (e.g., weak immune system, age > 69 years, obesity with BMI > 25, pregnant, chronic lung disease or other chronic medical condition) AND [2] COVID symptoms (e.g., cough, fever)  (Exceptions: Already seen by PCP and no new or worsening symptoms.)    Risk factors: age, hypertension  Answer Assessment - Initial Assessment Questions 1. COVID-19 DIAGNOSIS: "Who made your COVID-19 diagnosis?" "Was it confirmed by a positive lab test or self-test?" If not diagnosed by a doctor (or NP/PA), ask "Are there lots of cases (community spread) where you live?" Note: See public health department website, if unsure.     Covid positive. 2. COVID-19 EXPOSURE: "Was there any known exposure to COVID before the symptoms began?" CDC Definition of close contact: within 6 feet (2 meters) for a total of 15 minutes or more over a 24-hour period.      Tues negative  This morning tested  positive 3. ONSET: "When did the COVID-19 symptoms start?"      Monday coughing and sore throat and head congestion, don't ache, no fever, no diarrhea or vomiting.  She lives in an independent living facility.    4. WORST SYMPTOM: "What is your worst symptom?" (e.g., cough, fever, shortness of breath, muscle aches)     Coughing 5. COUGH: "Do you have a cough?" If Yes, ask: "How bad is the cough?"       Yes not coughing up anything 6. FEVER: "Do you have a fever?" If Yes, ask: "What is your temperature, how was it measured, and when did it start?"     No 7. RESPIRATORY STATUS: "Describe your breathing?" (e.g., shortness of breath, wheezing, unable to speak)      Clear mucus from nose.  Sneezing.  No shortness of breath or chest discomfort. 8. BETTER-SAME-WORSE: "Are you getting better, staying the same or getting worse compared to yesterday?"  If getting worse, ask, "In what way?"     Same 9. HIGH RISK DISEASE: "Do you have any chronic medical problems?" (e.g., asthma, heart or lung disease, weak immune system, obesity, etc.)     No 10. VACCINE: "Have you had the COVID-19 vaccine?" If Yes, ask: "Which one, how many shots, when did you get it?"       Yes 11. BOOSTER: "Have you received your COVID-19 booster?" If Yes, ask: "Which one and when did you get it?"  Just had 2nd booster week before last. 12. PREGNANCY: "Is there any chance you are pregnant?" "When was your last menstrual period?"       N/A 13. OTHER SYMPTOMS: "Do you have any other symptoms?"  (e.g., chills, fatigue, headache, loss of smell or taste, muscle pain, sore throat)       See above 14. O2 SATURATION MONITOR:  "Do you use an oxygen saturation monitor (pulse oximeter) at home?" If Yes, ask "What is your reading (oxygen level) today?" "What is your usual oxygen saturation reading?" (e.g., 95%)       No  Protocols used: CORONAVIRUS (COVID-19) DIAGNOSED OR SUSPECTED-A-AH

## 2021-03-30 NOTE — Telephone Encounter (Signed)
Pt advised.   Thanks,   -Jerika Wales  

## 2021-06-08 ENCOUNTER — Ambulatory Visit (INDEPENDENT_AMBULATORY_CARE_PROVIDER_SITE_OTHER): Payer: Medicare HMO | Admitting: Family Medicine

## 2021-06-08 ENCOUNTER — Encounter: Payer: Self-pay | Admitting: Family Medicine

## 2021-06-08 ENCOUNTER — Other Ambulatory Visit: Payer: Self-pay

## 2021-06-08 VITALS — BP 120/73 | HR 69 | Temp 98.2°F | Resp 16 | Ht 64.0 in | Wt 189.0 lb

## 2021-06-08 DIAGNOSIS — E785 Hyperlipidemia, unspecified: Secondary | ICD-10-CM | POA: Diagnosis not present

## 2021-06-08 DIAGNOSIS — R7303 Prediabetes: Secondary | ICD-10-CM

## 2021-06-08 DIAGNOSIS — I10 Essential (primary) hypertension: Secondary | ICD-10-CM | POA: Diagnosis not present

## 2021-06-08 DIAGNOSIS — D509 Iron deficiency anemia, unspecified: Secondary | ICD-10-CM

## 2021-06-08 LAB — POCT GLYCOSYLATED HEMOGLOBIN (HGB A1C)
Est. average glucose Bld gHb Est-mCnc: 134
Hemoglobin A1C: 6.3 % — AB (ref 4.0–5.6)

## 2021-06-08 NOTE — Assessment & Plan Note (Addendum)
Chronic and stable Recheck A1c today

## 2021-06-08 NOTE — Assessment & Plan Note (Signed)
Chronic and stable Continue HCTZ & Metoprolol Recheck CMP today

## 2021-06-08 NOTE — Progress Notes (Signed)
Established patient visit   Patient: Natalie Lucas   DOB: Sep 30, 1927   87 y.o. Female  MRN: [id number] Visit Date: 06/08/2021  Today's healthcare provider: Lavon Paganini, MD   Chief Complaint  Patient presents with   Hypertension   Hyperlipidemia   Hyperglycemia   Subjective    Hypertension - Currently on HCTZ, Metoprolol; denies side effects - Denies chest pain, SOB - Endorses chronic LE edema  Hyperlipidemia - Currently on Rosuvastatin; denies myalgias   History of Iron Deficiency Anemia - Denies SOB, tachycardia - No longer taking Fe supplement  Prediabetes - Denies polyuria, polydipsia     Medications: Outpatient Medications Prior to Visit  Medication Sig   hydrochlorothiazide (HYDRODIURIL) 25 MG tablet Take 1 tablet by mouth daily.   metoprolol tartrate (LOPRESSOR) 50 MG tablet TAKE ONE TABLET TWICE DAILY   [DISCONTINUED] rosuvastatin (CRESTOR) 5 MG tablet TAKE ONE TABLET BY MOUTH EVERY DAY   ferrous sulfate 324 (65 Fe) MG TBEC Take 1 pill three times daily. (Patient not taking: Reported on 06/08/2021)   No facility-administered medications prior to visit.    Review of Systems  Constitutional:  Negative for appetite change, chills, fatigue and fever.  HENT: Negative.    Eyes: Negative.   Respiratory: Negative.  Negative for chest tightness and shortness of breath.   Cardiovascular: Negative.  Negative for chest pain and leg swelling.  Gastrointestinal: Negative.   Endocrine: Negative.   Genitourinary: Negative.   Musculoskeletal: Negative.   Skin: Negative.   Neurological: Negative.       Objective    BP 120/73 (BP Location: Right Arm, Patient Position: Sitting, Cuff Size: Large)   Pulse 69   Temp 98.2 F (36.8 C) (Oral)   Resp 16   Ht '5\' 4"'$  (1.626 m)   Wt 189 lb (85.7 kg)   BMI 32.44 kg/m     Physical Exam Constitutional:      General: She is not in acute distress.    Appearance: Normal appearance. She is normal weight.  HENT:      Head: Normocephalic and atraumatic.     Right Ear: External ear normal.     Left Ear: External ear normal.  Eyes:     Conjunctiva/sclera: Conjunctivae normal.  Cardiovascular:     Rate and Rhythm: Normal rate and regular rhythm.     Pulses: Normal pulses.     Heart sounds: Normal heart sounds.  Pulmonary:     Effort: Pulmonary effort is normal.     Breath sounds: Normal breath sounds.  Abdominal:     General: Abdomen is flat. Bowel sounds are normal.     Palpations: Abdomen is soft.  Skin:    General: Skin is warm and dry.  Neurological:     General: No focal deficit present.     Mental Status: She is alert.  Psychiatric:        Behavior: Behavior normal.        Thought Content: Thought content normal.     Results for orders placed or performed in visit on 06/08/21  POCT glycosylated hemoglobin (Hb A1C)  Result Value Ref Range   Hemoglobin A1C 6.3 (A) 4.0 - 5.6 %   Est. average glucose Bld gHb Est-mCnc 134     Assessment & Plan     Problem List Items Addressed This Visit       Cardiovascular and Mediastinum   Essential hypertension    Chronic and stable Continue HCTZ & Metoprolol Recheck  CMP today      Relevant Orders   Comprehensive metabolic panel     Other   Hyperlipemia - Primary    Chronic and stable Discontinue statin Recheck lipid panel today      Relevant Orders   Comprehensive metabolic panel   Lipid panel   Microcytic anemia    Asymptomatic Recheck CBC & iron panel today      Relevant Orders   CBC   Iron, TIBC and Ferritin Panel   Prediabetes    Chronic and stable Recheck A1c today      Relevant Orders   POCT glycosylated hemoglobin (Hb A1C) (Completed)     Return in about 6 months (around 12/09/2021) for CPE, AWV.        Percell Locus, MS3    Patient seen along with MS3 student Percell Locus. I personally evaluated this patient along with the student, and verified all aspects of the history, physical exam, and medical  decision making as documented by the student. I agree with the student's documentation and have made all necessary edits.  Gabor Lusk, Dionne Bucy, MD, MPH [facility]

## 2021-06-08 NOTE — Assessment & Plan Note (Addendum)
Asymptomatic Recheck CBC & iron panel today

## 2021-06-08 NOTE — Assessment & Plan Note (Signed)
Chronic and stable Discontinue statin Recheck lipid panel today

## 2021-06-09 LAB — IRON,TIBC AND FERRITIN PANEL
Ferritin: 30 ng/mL (ref 15–150)
Iron Saturation: 46 % (ref 15–55)
Iron: 130 ug/dL (ref 27–139)
Total Iron Binding Capacity: 281 ug/dL (ref 250–450)
UIBC: 151 ug/dL (ref 118–369)

## 2021-06-09 LAB — CBC
Hematocrit: 37 % (ref 34.0–46.6)
Hemoglobin: 12.5 g/dL (ref 11.1–15.9)
MCH: 30.1 pg (ref 26.6–33.0)
MCHC: 33.8 g/dL (ref 31.5–35.7)
MCV: 89 fL (ref 79–97)
Platelets: 252 10*3/uL (ref 150–450)
RBC: 4.15 x10E6/uL (ref 3.77–5.28)
RDW: 12.9 % (ref 11.7–15.4)
WBC: 5.1 10*3/uL (ref 3.4–10.8)

## 2021-06-09 LAB — LIPID PANEL
Chol/HDL Ratio: 2.9 ratio (ref 0.0–4.4)
Cholesterol, Total: 177 mg/dL (ref 100–199)
HDL: 61 mg/dL (ref >39)
LDL Chol Calc (NIH): 91 mg/dL (ref 0–99)
Triglycerides: 144 mg/dL (ref 0–149)
VLDL Cholesterol Cal: 25 mg/dL (ref 5–40)

## 2021-06-09 LAB — COMPREHENSIVE METABOLIC PANEL
ALT: 11 IU/L (ref 0–32)
AST: 20 IU/L (ref 0–40)
Albumin/Globulin Ratio: 1.4 (ref 1.2–2.2)
Albumin: 3.8 g/dL (ref 3.5–4.6)
Alkaline Phosphatase: 94 IU/L (ref 44–121)
BUN/Creatinine Ratio: 21 (ref 12–28)
BUN: 18 mg/dL (ref 10–36)
Bilirubin Total: 0.3 mg/dL (ref 0.0–1.2)
CO2: 24 mmol/L (ref 20–29)
Calcium: 9.2 mg/dL (ref 8.7–10.3)
Chloride: 101 mmol/L (ref 96–106)
Creatinine, Ser: 0.85 mg/dL (ref 0.57–1.00)
Globulin, Total: 2.7 g/dL (ref 1.5–4.5)
Glucose: 80 mg/dL (ref 65–99)
Potassium: 4.4 mmol/L (ref 3.5–5.2)
Sodium: 139 mmol/L (ref 134–144)
Total Protein: 6.5 g/dL (ref 6.0–8.5)
eGFR: 64 mL/min/{1.73_m2} (ref >59)

## 2021-06-23 ENCOUNTER — Telehealth: Payer: Self-pay | Admitting: Family Medicine

## 2021-06-23 NOTE — Telephone Encounter (Signed)
[facility] faxed refill request for the following medications:     rosuvastatin (CRESTOR) 5 MG tablet   Please advise.

## 2021-08-21 ENCOUNTER — Telehealth: Payer: Self-pay | Admitting: Family Medicine

## 2021-08-21 NOTE — Telephone Encounter (Signed)
[facility] faxed refill request for the following medications:  metoprolol tartrate (LOPRESSOR) 50 MG tablet   rosuvastatin (CRESTOR) 5 MG tablet    Please advise.

## 2021-08-22 ENCOUNTER — Other Ambulatory Visit: Payer: Self-pay

## 2021-08-22 MED ORDER — METOPROLOL TARTRATE 50 MG PO TABS: 50.0000 mg | ORAL_TABLET | Freq: Two times a day (BID) | ORAL | 0 refills | Status: DC

## 2021-10-25 ENCOUNTER — Other Ambulatory Visit: Payer: Self-pay | Admitting: Family Medicine

## 2021-10-25 NOTE — Telephone Encounter (Signed)
Requested Prescriptions  Pending Prescriptions Disp Refills   metoprolol tartrate (LOPRESSOR) 50 MG tablet [Pharmacy Med Name: METOPROLOL TARTRATE 50 MG TAB] 180 tablet 0    Sig: TAKE ONE TABLET BY MOUTH TWICE DAILY AS DIRECTED     Cardiovascular:  Beta Blockers Passed - 10/25/2021  9:22 PM      Passed - Last BP in normal range    BP Readings from Last 1 Encounters:  06/08/21 120/73         Passed - Last Heart Rate in normal range    Pulse Readings from Last 1 Encounters:  06/08/21 69         Passed - Valid encounter within last 6 months    Recent Outpatient Visits          4 months ago Hyperlipidemia, unspecified hyperlipidemia type   [facility], Dionne Bucy, MD   2 years ago Annual physical exam   [facility] Carles Collet M, [place]   3 years ago Pressure injury of skin of contiguous region involving left buttock and hip, unspecified injury stage   [facility] [place], Wendee Beavers, [place]   3 years ago Annual physical exam   [facility] [place], Wendee Beavers, [place]   3 years ago [place], [place], [place]

## 2022-01-18 ENCOUNTER — Other Ambulatory Visit: Payer: Self-pay | Admitting: Family Medicine

## 2022-02-07 NOTE — Progress Notes (Addendum)
? ?I,Sulibeya S Dimas,acting as a scribe for Lavon Paganini, MD.,have documented all relevant documentation on the behalf of Lavon Paganini, MD,as directed by  Lavon Paganini, MD while in the presence of Lavon Paganini, MD. ? ? ? ?Annual Wellness Visit ? ?  ? ?Patient: Natalie Lucas, Female    DOB: 1927/03/09, 88 y.o.   MRN: [id number] ?Visit Date: 02/08/2022 ? ?Today's Provider: Lavon Paganini, MD  ? ?Chief Complaint  ?Patient presents with  ? Medicare Wellness  ? ?Subjective  ?  ?Natalie Lucas is a 88 y.o. female who presents today for her Annual Wellness Visit. ?She reports consuming a general diet. The patient does not participate in regular exercise at present. She generally feels well. She reports sleeping well. She does not have additional problems to discuss today.  ? ?HPI ? ?Can tell that mobility is decreasing.  Uses rollator that is upright  ? ?Medications: ?Outpatient Medications Prior to Visit  ?Medication Sig  ? metoprolol tartrate (LOPRESSOR) 50 MG tablet TAKE ONE TABLET BY MOUTH TWICE DAILY AS DIRECTED  ? [DISCONTINUED] hydrochlorothiazide (HYDRODIURIL) 25 MG tablet Take 1 tablet by mouth daily.  ? [DISCONTINUED] ferrous sulfate 324 (65 Fe) MG TBEC Take 1 pill three times daily. (Patient not taking: Reported on 06/08/2021)  ? ?No facility-administered medications prior to visit.  ?  ?Allergies  ?Allergen Reactions  ? Ace Inhibitors Cough  ? Aspirin   ?  Bleeding ulcers.  ? Salicylates   ?  Other reaction(s): Unknown  ? ? ?Patient Care Team: ?Virginia Crews, MD as PCP - General (Family Medicine) ?Isaias Cowman, MD as Consulting Physician (Cardiology) ? ?Review of Systems  ?HENT:  Positive for hearing loss and sinus pressure.   ?Respiratory:  Positive for shortness of breath.   ?Musculoskeletal:  Positive for arthralgias, back pain, gait problem and joint swelling.  ?All other systems reviewed and are negative. ? ?Last CBC ?Lab Results  ?Component Value Date  ? WBC 5.1  06/08/2021  ? HGB 12.5 06/08/2021  ? HCT 37.0 06/08/2021  ? MCV 89 06/08/2021  ? MCH 30.1 06/08/2021  ? RDW 12.9 06/08/2021  ? PLT 252 06/08/2021  ? ?Last metabolic panel ?Lab Results  ?Component Value Date  ? GLUCOSE 80 06/08/2021  ? NA 139 06/08/2021  ? K 4.4 06/08/2021  ? CL 101 06/08/2021  ? CO2 24 06/08/2021  ? BUN 18 06/08/2021  ? CREATININE 0.85 06/08/2021  ? EGFR 64 06/08/2021  ? CALCIUM 9.2 06/08/2021  ? PROT 6.5 06/08/2021  ? ALBUMIN 3.8 06/08/2021  ? LABGLOB 2.7 06/08/2021  ? AGRATIO 1.4 06/08/2021  ? BILITOT 0.3 06/08/2021  ? ALKPHOS 94 06/08/2021  ? AST 20 06/08/2021  ? ALT 11 06/08/2021  ? ?Last lipids ?Lab Results  ?Component Value Date  ? CHOL 177 06/08/2021  ? HDL 61 06/08/2021  ? Tibbie 91 06/08/2021  ? TRIG 144 06/08/2021  ? CHOLHDL 2.9 06/08/2021  ? ?Last hemoglobin A1c ?Lab Results  ?Component Value Date  ? HGBA1C 6.3 (A) 06/08/2021  ? ?Last thyroid functions ?Lab Results  ?Component Value Date  ? TSH 2.940 07/15/2015  ? ?  ?  ? Objective  ?  ?Vitals: BP (!) 92/52 (BP Location: Left Arm, Cuff Size: Normal)   Pulse 73   Temp 98.2 ?F (36.8 ?C) (Oral)   Resp 16   Wt 189 lb (85.7 kg)   SpO2 96%   BMI 32.44 kg/m?  ?BP Readings from Last 3 Encounters:  ?02/08/22 (!) 92/52  ?  06/08/21 120/73  ?09/26/20 133/84  ? ?Wt Readings from Last 3 Encounters:  ?02/08/22 189 lb (85.7 kg)  ?06/08/21 189 lb (85.7 kg)  ?09/26/20 189 lb 6 oz (85.9 kg)  ? ?  ? ? ?Physical Exam ?Vitals reviewed.  ?Constitutional:   ?   General: She is not in acute distress. ?   Appearance: Normal appearance. She is well-developed. She is not diaphoretic.  ?HENT:  ?   Head: Normocephalic and atraumatic.  ?   Right Ear: Tympanic membrane, ear canal and external ear normal.  ?   Left Ear: Tympanic membrane, ear canal and external ear normal.  ?   Nose: Nose normal.  ?   Mouth/Throat:  ?   Mouth: Mucous membranes are moist.  ?   Pharynx: Oropharynx is clear. No oropharyngeal exudate.  ?Eyes:  ?   General: No scleral icterus. ?    Conjunctiva/sclera: Conjunctivae normal.  ?   Pupils: Pupils are equal, round, and reactive to light.  ?Neck:  ?   Thyroid: No thyromegaly.  ?Cardiovascular:  ?   Rate and Rhythm: Normal rate and regular rhythm.  ?   Heart sounds: Normal heart sounds. No murmur heard. ?Pulmonary:  ?   Effort: Pulmonary effort is normal. No respiratory distress.  ?   Breath sounds: Normal breath sounds. No wheezing or rales.  ?Abdominal:  ?   General: There is no distension.  ?   Palpations: Abdomen is soft.  ?   Tenderness: There is no abdominal tenderness.  ?Musculoskeletal:  ?   Cervical back: Neck supple.  ?   Right lower leg: Edema present.  ?   Left lower leg: Edema present.  ?Lymphadenopathy:  ?   Cervical: No cervical adenopathy.  ?Skin: ?   General: Skin is warm and dry.  ?Neurological:  ?   Mental Status: She is alert and oriented to person, place, and time. Mental status is at baseline.  ?Psychiatric:     ?   Mood and Affect: Mood normal.     ?   Behavior: Behavior normal.     ?   Thought Content: Thought content normal.  ? ? ? ?Most recent functional status assessment: ? ?  02/08/2022  ?  3:19 PM  ?In your present state of health, do you have any difficulty performing the following activities:  ?Hearing? 1  ?Vision? 0  ?Difficulty concentrating or making decisions? 0  ?Walking or climbing stairs? 1  ?Dressing or bathing? 0  ?Doing errands, shopping? 0  ? ?Most recent fall risk assessment: ? ?  06/08/2021  ?  1:48 PM  ?Fall Risk   ?Falls in the past year? 0  ?Number falls in past yr: 0  ?Injury with Fall? 0  ?Risk for fall due to : No Fall Risks  ?Follow up Falls evaluation completed  ? ? Most recent depression screenings: ? ?  02/08/2022  ?  3:20 PM 06/08/2021  ?  1:48 PM  ?PHQ 2/9 Scores  ?PHQ - 2 Score 0 0  ?PHQ- 9 Score  5  ? ?Most recent cognitive screening: ? ?  02/08/2022  ?  3:20 PM  ?6CIT Screen  ?What Year? 0 points  ?What month? 0 points  ?What time? 0 points  ?Count back from 20 0 points  ?Months in reverse 0 points   ?Repeat phrase 2 points  ?Total Score 2 points  ? ?Most recent Audit-C alcohol use screening ? ?  02/08/2022  ?  3:19 PM  ?  Alcohol Use Disorder Test (AUDIT)  ?1. How often do you have a drink containing alcohol? 0  ?2. How many drinks containing alcohol do you have on a typical day when you are drinking? 0  ?3. How often do you have six or more drinks on one occasion? 0  ?AUDIT-C Score 0  ? ?A score of 3 or more in women, and 4 or more in men indicates increased risk for alcohol abuse, EXCEPT if all of the points are from question 1  ? ?No results found for any visits on 02/08/22. ? Assessment & Plan  ?  ? ?Annual wellness visit done today including the all of the following: ?Reviewed patient's Family Medical History ?Reviewed and updated list of patient's medical providers ?Assessment of cognitive impairment was done ?Assessed patient's functional ability ?Established a written schedule for health screening services ?Health Risk Assessent Completed and Reviewed ? ?Exercise Activities and Dietary recommendations ? Goals   ? ?  DIET - REDUCE PORTION SIZE   ?  Recommend to cut portions in half and to eat 3 small meals a day with two healthy snacks in between. ?  ? ?  ? ? ?Immunization History  ?Administered Date(s) Administered  ? Fluad Quad(high Dose 65+) 07/30/2019, 08/02/2020  ? Influenza Split 09/16/2009, 09/05/2011  ? Influenza, High Dose Seasonal PF 07/12/2015, 07/18/2016, 07/26/2017, 08/09/2018  ? PFIZER Comirnaty(Gray Top)Covid-19 Tri-Sucrose Vaccine 11/04/2019, 12/02/2019, 08/04/2020, 03/15/2021  ? Pneumococcal Conjugate-13 07/12/2015  ? Pneumococcal Polysaccharide-23 08/30/1998  ? ? ?Health Maintenance  ?Topic Date Due  ? TETANUS/TDAP  Never done  ? Zoster Vaccines- Shingrix (1 of 2) Never done  ? COVID-19 Vaccine (5 - Booster for Pfizer series) 05/10/2021  ? INFLUENZA VACCINE  05/29/2022  ? Pneumonia Vaccine 60+ Years old  Completed  ? DEXA SCAN  Completed  ? HPV VACCINES  Aged Out  ? ? ? ?Discussed health  benefits of physical activity, and encouraged her to engage in regular exercise appropriate for her age and condition.  ?  ?Problem List Items Addressed This Visit   ? ?  ? Cardiovascular and Mediastinum  ? Atrial flut

## 2022-02-08 ENCOUNTER — Ambulatory Visit (INDEPENDENT_AMBULATORY_CARE_PROVIDER_SITE_OTHER): Payer: Medicare HMO | Admitting: Family Medicine

## 2022-02-08 ENCOUNTER — Encounter: Payer: Self-pay | Admitting: Family Medicine

## 2022-02-08 VITALS — BP 92/52 | HR 73 | Temp 98.2°F | Resp 16 | Wt 189.0 lb

## 2022-02-08 DIAGNOSIS — I10 Essential (primary) hypertension: Secondary | ICD-10-CM

## 2022-02-08 DIAGNOSIS — I4892 Unspecified atrial flutter: Secondary | ICD-10-CM

## 2022-02-08 DIAGNOSIS — M810 Age-related osteoporosis without current pathological fracture: Secondary | ICD-10-CM

## 2022-02-08 DIAGNOSIS — Z0000 Encounter for general adult medical examination without abnormal findings: Secondary | ICD-10-CM

## 2022-02-08 DIAGNOSIS — E785 Hyperlipidemia, unspecified: Secondary | ICD-10-CM | POA: Diagnosis not present

## 2022-02-08 DIAGNOSIS — R7303 Prediabetes: Secondary | ICD-10-CM

## 2022-02-08 DIAGNOSIS — E559 Vitamin D deficiency, unspecified: Secondary | ICD-10-CM

## 2022-02-08 MED ORDER — HYDROCHLOROTHIAZIDE 12.5 MG PO CAPS: 12.5000 mg | ORAL_CAPSULE | Freq: Every day | ORAL | 0 refills | Status: DC

## 2022-02-08 NOTE — Assessment & Plan Note (Signed)
Recommend low carb diet °Recheck A1c  °

## 2022-02-08 NOTE — Assessment & Plan Note (Signed)
No longer seeing cardiology ? stable for many years ?In NSR today ?Not on anticoag 2/2 h/o GI bleed on ASA ?Continue metop for rate control ?

## 2022-02-08 NOTE — Patient Instructions (Signed)
Consider asking at the pharmacy about the tetanus shot (TDAP) and the shingles shot (Shingrix) ?

## 2022-02-08 NOTE — Assessment & Plan Note (Signed)
Reviewed last lipid panel °Not currently on a statin °Recheck FLP and CMP annually °Discussed diet and exercise  °

## 2022-02-08 NOTE — Assessment & Plan Note (Signed)
Not on medication ?Does not want to repeat DEXA ?No current fracture ?

## 2022-02-08 NOTE — Assessment & Plan Note (Signed)
BP low today ?Asymptomatic, but want to prevent falls, etc ?Will decrease HCTZ to 12.5 mg daily ?Continue metop at current dose ?Recheck metabolic panel ?

## 2022-02-09 LAB — BASIC METABOLIC PANEL
BUN/Creatinine Ratio: 19 (ref 12–28)
BUN: 19 mg/dL (ref 10–36)
CO2: 27 mmol/L (ref 20–29)
Calcium: 9.5 mg/dL (ref 8.7–10.3)
Chloride: 100 mmol/L (ref 96–106)
Creatinine, Ser: 0.98 mg/dL (ref 0.57–1.00)
Glucose: 121 mg/dL — ABNORMAL HIGH (ref 70–99)
Potassium: 4.2 mmol/L (ref 3.5–5.2)
Sodium: 140 mmol/L (ref 134–144)
eGFR: 54 mL/min/{1.73_m2} — ABNORMAL LOW (ref >59)

## 2022-02-09 LAB — HEMOGLOBIN A1C
Est. average glucose Bld gHb Est-mCnc: 140 mg/dL
Hgb A1c MFr Bld: 6.5 % — ABNORMAL HIGH (ref 4.8–5.6)

## 2022-02-09 LAB — VITAMIN D 25 HYDROXY (VIT D DEFICIENCY, FRACTURES): Vit D, 25-Hydroxy: 33 ng/mL (ref 30.0–100.0)

## 2022-03-19 ENCOUNTER — Other Ambulatory Visit: Payer: Self-pay | Admitting: Family Medicine

## 2022-04-09 ENCOUNTER — Encounter: Payer: Self-pay | Admitting: Family Medicine

## 2022-04-09 ENCOUNTER — Ambulatory Visit (INDEPENDENT_AMBULATORY_CARE_PROVIDER_SITE_OTHER): Payer: Medicare HMO | Admitting: Family Medicine

## 2022-04-09 VITALS — BP 105/67 | HR 71 | Temp 98.6°F | Resp 16 | Wt 180.0 lb

## 2022-04-09 DIAGNOSIS — I878 Other specified disorders of veins: Secondary | ICD-10-CM | POA: Diagnosis not present

## 2022-04-09 DIAGNOSIS — R7303 Prediabetes: Secondary | ICD-10-CM

## 2022-04-09 DIAGNOSIS — I10 Essential (primary) hypertension: Secondary | ICD-10-CM

## 2022-04-09 MED ORDER — METOPROLOL TARTRATE 50 MG PO TABS: 50.0000 mg | ORAL_TABLET | Freq: Two times a day (BID) | ORAL | 1 refills | Status: DC

## 2022-04-09 MED ORDER — HYDROCHLOROTHIAZIDE 12.5 MG PO CAPS: 12.5000 mg | ORAL_CAPSULE | Freq: Every day | ORAL | 1 refills | Status: DC

## 2022-04-09 NOTE — Assessment & Plan Note (Signed)
Chronic and stable LE edema Discussed compression and elevation

## 2022-04-09 NOTE — Progress Notes (Signed)
I,Sulibeya S Dimas,acting as a Education administrator for Lavon Paganini, MD.,have documented all relevant documentation on the behalf of Lavon Paganini, MD,as directed by  Lavon Paganini, MD while in the presence of Lavon Paganini, MD.   Established patient visit   Patient: Natalie Lucas   DOB: September 25, 1927   88 y.o. Female  MRN: [id number] Visit Date: 04/09/2022  Today's healthcare provider: Lavon Paganini, MD   Chief Complaint  Patient presents with   Hypertension   Subjective    HPI  Hypertension, follow-up  BP Readings from Last 3 Encounters:  04/09/22 105/67  02/08/22 (!) 92/52  06/08/21 120/73   Wt Readings from Last 3 Encounters:  04/09/22 180 lb (81.6 kg)  02/08/22 189 lb (85.7 kg)  06/08/21 189 lb (85.7 kg)     She was last seen for hypertension 2 months ago.  BP at that visit was 92/52. Management since that visit includes decrease HCTZ to 12.50m daily, continue metoprolol at current dose.  She reports excellent compliance with treatment. She is not having side effects.   Use of agents associated with hypertension: none.   Outside blood pressures are not being checked. Symptoms: No chest pain No chest pressure  No palpitations No syncope  No dyspnea No orthopnea  No paroxysmal nocturnal dyspnea Yes lower extremity edema   Pertinent labs Lab Results  Component Value Date   CHOL 177 06/08/2021   HDL 61 06/08/2021   LDLCALC 91 06/08/2021   TRIG 144 06/08/2021   CHOLHDL 2.9 06/08/2021   Lab Results  Component Value Date   NA 140 02/08/2022   K 4.2 02/08/2022   CREATININE 0.98 02/08/2022   EGFR 54 (L) 02/08/2022   GLUCOSE 121 (H) 02/08/2022   TSH 2.940 07/15/2015     The ASCVD Risk score (Arnett DK, et al., 2019) failed to calculate for the following reasons:   The 2019 ASCVD risk score is only valid for ages 434to  729 ---------------------------------------------------------------------------------------------------   Medications: Outpatient Medications Prior to Visit  Medication Sig   [DISCONTINUED] hydrochlorothiazide (MICROZIDE) 12.5 MG capsule TAKE 1 CAPSULE BY MOUTH ONCE DAILY   [DISCONTINUED] metoprolol tartrate (LOPRESSOR) 50 MG tablet TAKE ONE TABLET BY MOUTH TWICE DAILY AS DIRECTED   No facility-administered medications prior to visit.    Review of Systems  Constitutional:  Negative for appetite change and fatigue.  Respiratory:  Negative for chest tightness and shortness of breath.   Cardiovascular:  Positive for leg swelling. Negative for chest pain and palpitations.        Objective    BP 105/67 (BP Location: Left Arm, Patient Position: Sitting, Cuff Size: Large)   Pulse 71   Temp 98.6 F (37 C) (Oral)   Resp 16   Wt 180 lb (81.6 kg)   BMI 30.90 kg/m  BP Readings from Last 3 Encounters:  04/09/22 105/67  02/08/22 (!) 92/52  06/08/21 120/73   Wt Readings from Last 3 Encounters:  04/09/22 180 lb (81.6 kg)  02/08/22 189 lb (85.7 kg)  06/08/21 189 lb (85.7 kg)      Physical Exam Vitals reviewed.  Constitutional:      General: She is not in acute distress.    Appearance: Normal appearance. She is well-developed. She is not diaphoretic.  HENT:     Head: Normocephalic and atraumatic.  Eyes:     General: No scleral icterus.    Conjunctiva/sclera: Conjunctivae normal.  Neck:     Thyroid: No thyromegaly.  Cardiovascular:  Rate and Rhythm: Normal rate and regular rhythm.     Pulses: Normal pulses.     Heart sounds: Normal heart sounds. No murmur heard. Pulmonary:     Effort: Pulmonary effort is normal. No respiratory distress.     Breath sounds: Normal breath sounds. No wheezing, rhonchi or rales.  Musculoskeletal:     Cervical back: Neck supple.     Right lower leg: Edema present.     Left lower leg: Edema present.  Lymphadenopathy:     Cervical: No  cervical adenopathy.  Skin:    General: Skin is warm and dry.     Capillary Refill: Capillary refill takes less than 2 seconds.     Findings: No rash.  Neurological:     Mental Status: She is alert and oriented to person, place, and time. Mental status is at baseline.  Psychiatric:        Mood and Affect: Mood normal.        Behavior: Behavior normal.       No results found for any visits on 04/09/22.  Assessment & Plan     Problem List Items Addressed This Visit       Cardiovascular and Mediastinum   Essential hypertension - Primary    Well controlled Continue current medications Reviewed recent metabolic panel      Relevant Medications   metoprolol tartrate (LOPRESSOR) 50 MG tablet   hydrochlorothiazide (MICROZIDE) 12.5 MG capsule     Other   Prediabetes    Last A1c up to 6.5 No meds at this time Discussed low carb diet Recheck a1c at next visit      Venous stasis    Chronic and stable LE edema Discussed compression and elevation        Return in about 6 months (around 10/09/2022) for AWV, CPE.      I, Lavon Paganini, MD, have reviewed all documentation for this visit. The documentation on 04/09/22 for the exam, diagnosis, procedures, and orders are all accurate and complete.   Jennica Tagliaferri, Dionne Bucy, MD, MPH [facility]

## 2022-04-09 NOTE — Assessment & Plan Note (Signed)
Last A1c up to 6.5 No meds at this time Discussed low carb diet Recheck a1c at next visit

## 2022-04-09 NOTE — Assessment & Plan Note (Signed)
Well controlled Continue current medications Reviewed recent metabolic panel 

## 2022-10-09 NOTE — Progress Notes (Unsigned)
I,Jahsiah Carpenter S Launa Goedken,acting as a Education administrator for Lavon Paganini, MD.,have documented all relevant documentation on the behalf of Lavon Paganini, MD,as directed by  Lavon Paganini, MD while in the presence of Lavon Paganini, MD.   Established patient visit   Patient: Natalie Lucas   DOB: 1927-01-16   88 y.o. Female  MRN: [id number] Visit Date: 10/11/2022  Today's healthcare provider: Lavon Paganini, MD   Chief Complaint  Patient presents with   Hyperlipidemia   Hypertension   Prediabetes   Subjective    HPI  Prediabetes, Follow-up  Lab Results  Component Value Date   HGBA1C 6.4 (A) 10/11/2022   HGBA1C 6.5 (H) 02/08/2022   HGBA1C 6.3 (A) 06/08/2021   GLUCOSE 121 (H) 02/08/2022   GLUCOSE 80 06/08/2021   GLUCOSE 145 (H) 08/02/2020    Last seen for for this6 months ago.  Management since that visit includes no changes. Current symptoms include paresthesia of the feet and have been worsening.   Pertinent Labs:    Component Value Date/Time   CHOL 177 06/08/2021 1348   TRIG 144 06/08/2021 1348   CHOLHDL 2.9 06/08/2021 1348   CREATININE 0.98 02/08/2022 1554    Wt Readings from Last 3 Encounters:  10/11/22 188 lb 1.6 oz (85.3 kg)  04/09/22 180 lb (81.6 kg)  02/08/22 189 lb (85.7 kg)    ----------------------------------------------------------------------------------------- Hypertension, follow-up  BP Readings from Last 3 Encounters:  10/11/22 122/77  04/09/22 105/67  02/08/22 (!) 92/52   Wt Readings from Last 3 Encounters:  10/11/22 188 lb 1.6 oz (85.3 kg)  04/09/22 180 lb (81.6 kg)  02/08/22 189 lb (85.7 kg)     She was last seen for hypertension 6 months ago.  BP at that visit was 105/67. Management since that visit includes no changes. She reports excellent compliance with treatment. She is not having side effects.    Outside blood pressures are not being  checked.   --------------------------------------------------------------------------------------------------- Lipid/Cholesterol, follow-up  Last Lipid Panel: Lab Results  Component Value Date   CHOL 177 06/08/2021   LDLCALC 91 06/08/2021   HDL 61 06/08/2021   TRIG 144 06/08/2021    She was last seen for this 6 months ago.  Management since that visit includes no changes.  She reports excellent compliance with treatment. She is not having side effects.   Last metabolic panel Lab Results  Component Value Date   GLUCOSE 121 (H) 02/08/2022   NA 140 02/08/2022   K 4.2 02/08/2022   BUN 19 02/08/2022   CREATININE 0.98 02/08/2022   EGFR 54 (L) 02/08/2022   GFRNONAA 58 (L) 08/02/2020   CALCIUM 9.5 02/08/2022   AST 20 06/08/2021   ALT 11 06/08/2021   The ASCVD Risk score (Arnett DK, et al., 2019) failed to calculate for the following reasons:   The 2019 ASCVD risk score is only valid for ages 77 to 42  ---------------------------------------------------------------------------------------------------   Medications: Outpatient Medications Prior to Visit  Medication Sig   hydrochlorothiazide (MICROZIDE) 12.5 MG capsule Take 1 capsule (12.5 mg total) by mouth daily.   metoprolol tartrate (LOPRESSOR) 50 MG tablet Take 1 tablet (50 mg total) by mouth 2 (two) times daily.   No facility-administered medications prior to visit.    Review of Systems  Constitutional:  Negative for appetite change, chills, fatigue and fever.  Eyes:  Negative for visual disturbance.  Respiratory:  Negative for chest tightness and shortness of breath.   Cardiovascular:  Positive for leg swelling. Negative for chest pain.  Gastrointestinal:  Negative for abdominal pain, nausea and vomiting.  Endocrine: Negative for cold intolerance and heat intolerance.  Neurological:  Positive for numbness. Negative for dizziness, light-headedness and headaches.       Objective    BP 122/77 (BP Location: Left  Arm, Patient Position: Sitting, Cuff Size: Large)   Pulse 75   Temp 98 F (36.7 C) (Oral)   Resp 16   Wt 188 lb 1.6 oz (85.3 kg)   BMI 32.29 kg/m  BP Readings from Last 3 Encounters:  10/11/22 122/77  04/09/22 105/67  02/08/22 (!) 92/52   Wt Readings from Last 3 Encounters:  10/11/22 188 lb 1.6 oz (85.3 kg)  04/09/22 180 lb (81.6 kg)  02/08/22 189 lb (85.7 kg)      Physical Exam Vitals reviewed.  Constitutional:      General: She is not in acute distress.    Appearance: Normal appearance. She is well-developed. She is not diaphoretic.  HENT:     Head: Normocephalic and atraumatic.  Eyes:     General: No scleral icterus.    Conjunctiva/sclera: Conjunctivae normal.  Neck:     Thyroid: No thyromegaly.  Cardiovascular:     Rate and Rhythm: Normal rate and regular rhythm.     Heart sounds: Normal heart sounds. No murmur heard. Pulmonary:     Effort: Pulmonary effort is normal. No respiratory distress.     Breath sounds: Normal breath sounds. No wheezing, rhonchi or rales.  Musculoskeletal:     Cervical back: Neck supple.     Right lower leg: Edema present.     Left lower leg: Edema present.  Lymphadenopathy:     Cervical: No cervical adenopathy.  Skin:    General: Skin is warm and dry.     Findings: No rash.  Neurological:     Mental Status: She is alert and oriented to person, place, and time. Mental status is at baseline.  Psychiatric:        Mood and Affect: Mood normal.        Behavior: Behavior normal.       Results for orders placed or performed in visit on 10/11/22  POCT glycosylated hemoglobin (Hb A1C)  Result Value Ref Range   Hemoglobin A1C 6.4 (A) 4.0 - 5.6 %   Est. average glucose Bld gHb Est-mCnc 137     Assessment & Plan   Problem List Items Addressed This Visit       Cardiovascular and Mediastinum   Essential hypertension - Primary    Well controlled Continue current medications Recheck metabolic panel F/u in 6 months       Relevant  Orders   Comprehensive metabolic panel     Other   Hyperlipemia    Reviewed last lipid panel Not currently on a statin Recheck FLP and CMP Discussed diet and exercise  May stop monitoring at some point as we are not going to do primary prevention at age 85      Relevant Orders   Comprehensive metabolic panel   Lipid panel   Prediabetes    Stable A1c Recommend low carb diet       Relevant Orders   POCT glycosylated hemoglobin (Hb A1C) (Completed)     Return in about 6 months (around 04/12/2023) for CPE, AWV.      I, Lavon Paganini, MD, have reviewed all documentation for this visit. The documentation on 10/11/22 for the exam, diagnosis, procedures, and orders are all accurate and complete.   Bacigalupo,  Dionne Bucy, MD, MPH [facility]

## 2022-10-11 ENCOUNTER — Ambulatory Visit (INDEPENDENT_AMBULATORY_CARE_PROVIDER_SITE_OTHER): Payer: Medicare HMO | Admitting: Family Medicine

## 2022-10-11 ENCOUNTER — Encounter: Payer: Self-pay | Admitting: Family Medicine

## 2022-10-11 VITALS — BP 122/77 | HR 75 | Temp 98.0°F | Resp 16 | Wt 188.1 lb

## 2022-10-11 DIAGNOSIS — R7303 Prediabetes: Secondary | ICD-10-CM

## 2022-10-11 DIAGNOSIS — I10 Essential (primary) hypertension: Secondary | ICD-10-CM | POA: Diagnosis not present

## 2022-10-11 DIAGNOSIS — E785 Hyperlipidemia, unspecified: Secondary | ICD-10-CM

## 2022-10-11 LAB — POCT GLYCOSYLATED HEMOGLOBIN (HGB A1C)
Est. average glucose Bld gHb Est-mCnc: 137
Hemoglobin A1C: 6.4 % — AB (ref 4.0–5.6)

## 2022-10-11 NOTE — Assessment & Plan Note (Signed)
Well controlled Continue current medications Recheck metabolic panel F/u in 6 months  

## 2022-10-11 NOTE — Assessment & Plan Note (Signed)
Stable A1c Recommend low carb diet

## 2022-10-11 NOTE — Assessment & Plan Note (Signed)
Reviewed last lipid panel Not currently on a statin Recheck FLP and CMP Discussed diet and exercise  May stop monitoring at some point as we are not going to do primary prevention at age 88

## 2022-10-12 LAB — COMPREHENSIVE METABOLIC PANEL
ALT: 11 IU/L (ref 0–32)
AST: 15 IU/L (ref 0–40)
Albumin/Globulin Ratio: 1.3 (ref 1.2–2.2)
Albumin: 3.6 g/dL (ref 3.6–4.6)
Alkaline Phosphatase: 111 IU/L (ref 44–121)
BUN/Creatinine Ratio: 15 (ref 12–28)
BUN: 13 mg/dL (ref 10–36)
Bilirubin Total: 0.3 mg/dL (ref 0.0–1.2)
CO2: 23 mmol/L (ref 20–29)
Calcium: 8.9 mg/dL (ref 8.7–10.3)
Chloride: 102 mmol/L (ref 96–106)
Creatinine, Ser: 0.88 mg/dL (ref 0.57–1.00)
Globulin, Total: 2.8 g/dL (ref 1.5–4.5)
Glucose: 166 mg/dL — ABNORMAL HIGH (ref 70–99)
Potassium: 4.2 mmol/L (ref 3.5–5.2)
Sodium: 139 mmol/L (ref 134–144)
Total Protein: 6.4 g/dL (ref 6.0–8.5)
eGFR: 61 mL/min/{1.73_m2} (ref >59)

## 2022-10-12 LAB — LIPID PANEL
Chol/HDL Ratio: 4.3 ratio (ref 0.0–4.4)
Cholesterol, Total: 239 mg/dL — ABNORMAL HIGH (ref 100–199)
HDL: 56 mg/dL (ref >39)
LDL Chol Calc (NIH): 160 mg/dL — ABNORMAL HIGH (ref 0–99)
Triglycerides: 131 mg/dL (ref 0–149)
VLDL Cholesterol Cal: 23 mg/dL (ref 5–40)

## 2022-12-03 ENCOUNTER — Other Ambulatory Visit: Payer: Self-pay | Admitting: Family Medicine

## 2022-12-03 ENCOUNTER — Telehealth: Payer: Self-pay | Admitting: Family Medicine

## 2022-12-03 NOTE — Telephone Encounter (Signed)
Patient advised.

## 2022-12-03 NOTE — Telephone Encounter (Signed)
Copied from [place] [id number]. Topic: General - Other >> Dec 03, 2022 10:14 AM Sabas Sous wrote: Reason for CRM: Pt called and needs to know if there is any reason she should not take prednisone, considering her current medications. She needs approval first for [place] at [facility]. She says she is just fine other than her back. Estill Bamberg wanted her to follow up with a cardiologist but she does not see one.  [phone]

## 2022-12-03 NOTE — Telephone Encounter (Signed)
Yes. She is ok to take prednisone if needed. It does not interact with her medications.  She might notice a temporary increase in blood pressure while on the prednisone

## 2023-01-01 ENCOUNTER — Telehealth: Payer: Self-pay | Admitting: Family Medicine

## 2023-01-01 NOTE — Telephone Encounter (Signed)
Contacted Natalie Lucas to schedule their annual wellness visit. Appointment made for 03/05/2023.  [facility] Direct Dial: [phone]

## 2023-01-18 ENCOUNTER — Other Ambulatory Visit: Payer: Self-pay | Admitting: Family Medicine

## 2023-01-18 NOTE — Telephone Encounter (Signed)
Requested Prescriptions  Pending Prescriptions Disp Refills   metoprolol tartrate (LOPRESSOR) 50 MG tablet [Pharmacy Med Name: METOPROLOL TARTRATE 50 MG TAB] 180 tablet 0    Sig: TAKE ONE TABLET BY MOUTH TWICE DAILY     Cardiovascular:  Beta Blockers Passed - 01/18/2023  9:54 AM      Passed - Last BP in normal range    BP Readings from Last 1 Encounters:  10/11/22 122/77         Passed - Last Heart Rate in normal range    Pulse Readings from Last 1 Encounters:  10/11/22 75         Passed - Valid encounter within last 6 months    Recent Outpatient Visits           3 months ago Essential hypertension   [place], Dionne Bucy, MD   9 months ago Essential hypertension   [place], Dionne Bucy, MD   11 months ago Encounter for annual wellness visit (AWV) in Medicare patient   [place], Dionne Bucy, MD   1 year ago Hyperlipidemia, unspecified hyperlipidemia type   [facility] [place], Dionne Bucy, MD   3 years ago Annual physical exam   [facility] [place], [place]       Future Appointments             In 2 months [place], Dionne Bucy, MD [facility]

## 2023-02-25 ENCOUNTER — Telehealth: Payer: Self-pay | Admitting: Family Medicine

## 2023-02-25 NOTE — Telephone Encounter (Signed)
Copied from CRM [id number]. Topic: Medicare AWV >> Feb 25, 2023 11:57 AM Rushie Goltz wrote: Reason for CRM: I left a message on patient's voice mail to reschedule AWV phone call on 03/05/2023 at 3:30. [facility] will be out of office for a meeting. If patient returns my call, please reschedule appointment or transfer call to me at [phone]. Thank you,  [facility] Direct dial  [phone]

## 2023-03-05 ENCOUNTER — Ambulatory Visit (INDEPENDENT_AMBULATORY_CARE_PROVIDER_SITE_OTHER): Payer: Medicare HMO

## 2023-03-05 VITALS — Ht 64.0 in | Wt 188.0 lb

## 2023-03-05 DIAGNOSIS — Z0000 Encounter for general adult medical examination without abnormal findings: Secondary | ICD-10-CM

## 2023-03-05 NOTE — Patient Instructions (Signed)
Natalie Lucas , Thank you for taking time to come for your Medicare Wellness Visit. I appreciate your ongoing commitment to your health goals. Please review the following plan we discussed and let me know if I can assist you in the future.   These are the goals we discussed:  Goals      DIET - REDUCE PORTION SIZE     Recommend to cut portions in half and to eat 3 small meals a day with two healthy snacks in between.        This is a list of the screening recommended for you and due dates:  Health Maintenance  Topic Date Due   DTaP/Tdap/Td vaccine (1 - Tdap) Never done   Zoster (Shingles) Vaccine (2 of 2) 07/06/2022   Flu Shot  05/30/2023   Medicare Annual Wellness Visit  03/04/2024   Pneumonia Vaccine  Completed   DEXA scan (bone density measurement)  Completed   COVID-19 Vaccine  Completed   HPV Vaccine  Aged Out    Advanced directives: yes  Conditions/risks identified: moderate falls risk  Next appointment: Follow up in one year for your annual wellness visit 03/10/2024 @1pm   telephone   Preventive Care 65 Years and Older, Female Preventive care refers to lifestyle choices and visits with your health care provider that can promote health and wellness. What does preventive care include? A yearly physical exam. This is also called an annual well check. Dental exams once or twice a year. Routine eye exams. Ask your health care provider how often you should have your eyes checked. Personal lifestyle choices, including: Daily care of your teeth and gums. Regular physical activity. Eating a healthy diet. Avoiding tobacco and drug use. Limiting alcohol use. Practicing safe sex. Taking low-dose aspirin every day. Taking vitamin and mineral supplements as recommended by your health care provider. What happens during an annual well check? The services and screenings done by your health care provider during your annual well check will depend on your age, overall health, lifestyle  risk factors, and family history of disease. Counseling  Your health care provider may ask you questions about your: Alcohol use. Tobacco use. Drug use. Emotional well-being. Home and relationship well-being. Sexual activity. Eating habits. History of falls. Memory and ability to understand (cognition). Work and work Astronomer. Reproductive health. Screening  You may have the following tests or measurements: Height, weight, and BMI. Blood pressure. Lipid and cholesterol levels. These may be checked every 5 years, or more frequently if you are over 68 years old. Skin check. Lung cancer screening. You may have this screening every year starting at age 50 if you have a 30-pack-year history of smoking and currently smoke or have quit within the past 15 years. Fecal occult blood test (FOBT) of the stool. You may have this test every year starting at age 70. Flexible sigmoidoscopy or colonoscopy. You may have a sigmoidoscopy every 5 years or a colonoscopy every 10 years starting at age 53. Hepatitis C blood test. Hepatitis B blood test. Sexually transmitted disease (STD) testing. Diabetes screening. This is done by checking your blood sugar (glucose) after you have not eaten for a while (fasting). You may have this done every 1-3 years. Bone density scan. This is done to screen for osteoporosis. You may have this done starting at age 85. Mammogram. This may be done every 1-2 years. Talk to your health care provider about how often you should have regular mammograms. Talk with your health care provider about  your test results, treatment options, and if necessary, the need for more tests. Vaccines  Your health care provider may recommend certain vaccines, such as: Influenza vaccine. This is recommended every year. Tetanus, diphtheria, and acellular pertussis (Tdap, Td) vaccine. You may need a Td booster every 10 years. Zoster vaccine. You may need this after age 45. Pneumococcal  13-valent conjugate (PCV13) vaccine. One dose is recommended after age 10. Pneumococcal polysaccharide (PPSV23) vaccine. One dose is recommended after age 8. Talk to your health care provider about which screenings and vaccines you need and how often you need them. This information is not intended to replace advice given to you by your health care provider. Make sure you discuss any questions you have with your health care provider. Document Released: 11/11/2015 Document Revised: 07/04/2016 Document Reviewed: 08/16/2015 Elsevier Interactive Patient Education  2017 [place] Prevention in the Home Falls can cause injuries. They can happen to people of all ages. There are many things you can do to make your home safe and to help prevent falls. What can I do on the outside of my home? Regularly fix the edges of walkways and driveways and fix any cracks. Remove anything that might make you trip as you walk through a door, such as a raised step or threshold. Trim any bushes or trees on the path to your home. Use bright outdoor lighting. Clear any walking paths of anything that might make someone trip, such as rocks or tools. Regularly check to see if handrails are loose or broken. Make sure that both sides of any steps have handrails. Any raised decks and porches should have guardrails on the edges. Have any leaves, snow, or ice cleared regularly. Use sand or salt on walking paths during winter. Clean up any spills in your garage right away. This includes oil or grease spills. What can I do in the bathroom? Use night lights. Install grab bars by the toilet and in the tub and shower. Do not use towel bars as grab bars. Use non-skid mats or decals in the tub or shower. If you need to sit down in the shower, use a plastic, non-slip stool. Keep the floor dry. Clean up any water that spills on the floor as soon as it happens. Remove soap buildup in the tub or shower regularly. Attach  bath mats securely with double-sided non-slip rug tape. Do not have throw rugs and other things on the floor that can make you trip. What can I do in the bedroom? Use night lights. Make sure that you have a light by your bed that is easy to reach. Do not use any sheets or blankets that are too big for your bed. They should not hang down onto the floor. Have a firm chair that has side arms. You can use this for support while you get dressed. Do not have throw rugs and other things on the floor that can make you trip. What can I do in the kitchen? Clean up any spills right away. Avoid walking on wet floors. Keep items that you use a lot in easy-to-reach places. If you need to reach something above you, use a strong step stool that has a grab bar. Keep electrical cords out of the way. Do not use floor polish or wax that makes floors slippery. If you must use wax, use non-skid floor wax. Do not have throw rugs and other things on the floor that can make you trip. What can I do with  my stairs? Do not leave any items on the stairs. Make sure that there are handrails on both sides of the stairs and use them. Fix handrails that are broken or loose. Make sure that handrails are as long as the stairways. Check any carpeting to make sure that it is firmly attached to the stairs. Fix any carpet that is loose or worn. Avoid having throw rugs at the top or bottom of the stairs. If you do have throw rugs, attach them to the floor with carpet tape. Make sure that you have a light switch at the top of the stairs and the bottom of the stairs. If you do not have them, ask someone to add them for you. What else can I do to help prevent falls? Wear shoes that: Do not have high heels. Have rubber bottoms. Are comfortable and fit you well. Are closed at the toe. Do not wear sandals. If you use a stepladder: Make sure that it is fully opened. Do not climb a closed stepladder. Make sure that both sides of the  stepladder are locked into place. Ask someone to hold it for you, if possible. Clearly mark and make sure that you can see: Any grab bars or handrails. First and last steps. Where the edge of each step is. Use tools that help you move around (mobility aids) if they are needed. These include: Canes. Walkers. Scooters. Crutches. Turn on the lights when you go into a dark area. Replace any light bulbs as soon as they burn out. Set up your furniture so you have a clear path. Avoid moving your furniture around. If any of your floors are uneven, fix them. If there are any pets around you, be aware of where they are. Review your medicines with your doctor. Some medicines can make you feel dizzy. This can increase your chance of falling. Ask your doctor what other things that you can do to help prevent falls. This information is not intended to replace advice given to you by your health care provider. Make sure you discuss any questions you have with your health care provider. Document Released: 08/11/2009 Document Revised: 03/22/2016 Document Reviewed: 11/19/2014 Elsevier Interactive Patient Education  2017 [employer].

## 2023-03-05 NOTE — Progress Notes (Signed)
I connected with  Natalie Lucas on 03/05/23 by a audio enabled telemedicine application and verified that I am speaking with the correct person using two identifiers.  Patient Location: Home  Provider Location: Office/Clinic  I discussed the limitations of evaluation and management by telemedicine. The patient expressed understanding and agreed to proceed.  Subjective:   Natalie Lucas is a 89 y.o. female who presents for Medicare Annual (Subsequent) preventive examination.  Review of Systems     Cardiac Risk Factors include: advanced age (>87men, >26 women);dyslipidemia;hypertension;sedentary lifestyle;obesity (BMI >30kg/m2)    Objective:    Today's Vitals   03/05/23 1537  Weight: 188 lb (85.3 kg)  Height: 5\' 4"  (1.626 m)   Body mass index is 32.27 kg/m.     03/05/2023    3:49 PM 03/11/2018    2:11 PM  Advanced Directives  Does Patient Have a Medical Advance Directive? Yes Yes  Type of Estate agent of [place];Living will Healthcare Power of [place];Living will  Copy of Healthcare Power of Attorney in Chart?  No - copy requested    Current Medications (verified) Outpatient Encounter Medications as of 03/05/2023  Medication Sig   gabapentin (NEURONTIN) 100 MG capsule Take 100 mg by mouth 3 (three) times daily.   hydrochlorothiazide (MICROZIDE) 12.5 MG capsule TAKE 1 CAPSULE BY MOUTH EVERY DAY   metoprolol tartrate (LOPRESSOR) 50 MG tablet TAKE ONE TABLET BY MOUTH TWICE DAILY   LIDODERM 5 % APPLY 1 PATCH BY TOPICAL ROUTE ONCE DAILY (MAY WEAR UP TO 12HOURS.) (Patient not taking: Reported on 03/05/2023)   No facility-administered encounter medications on file as of 03/05/2023.    Allergies (verified) Ace inhibitors, Aspirin, and Salicylates   History: Past Medical History:  Diagnosis Date   Hyperlipidemia    Hypertension    Past Surgical History:  Procedure Laterality Date   BREAST SURGERY Right 08/2002   Ductal Hyperplasia   COSMETIC SURGERY   1993   JOINT REPLACEMENT Left    Left knee replacement.   KNEE SURGERY Left 2002   Family History  Problem Relation Age of Onset   Dementia Mother    Transient ischemic attack Mother    Lung cancer Father    Cancer Paternal Uncle    Cancer Paternal Grandfather    Melanoma Cousin    Social History   Socioeconomic History   Marital status: Widowed    Spouse name: Not on file   Number of children: 2   Years of education: College   Highest education level: Bachelor's degree (e.g., BA, AB, BS)  Occupational History   Occupation: Retired  Tobacco Use   Smoking status: Former   Smokeless tobacco: Never   Tobacco comments:    social smoker - quit 60+ years ago  Vaping Use   Vaping Use: Never used  Substance and Sexual Activity   Alcohol use: No   Drug use: No   Sexual activity: Not on file  Other Topics Concern   Not on file  Social History Narrative   Not on file   Social Determinants of Health   Financial Resource Strain: Low Risk  (03/05/2023)   Overall Financial Resource Strain (CARDIA)    Difficulty of Paying Living Expenses: Not hard at all  Food Insecurity: No Food Insecurity (03/05/2023)   Hunger Vital Sign    Worried About Running Out of Food in the Last Year: Never true    Ran Out of Food in the Last Year: Never true  Transportation Needs:  No Transportation Needs (03/05/2023)   PRAPARE - Administrator, Civil Service (Medical): No    Lack of Transportation (Non-Medical): No  Physical Activity: Inactive (03/05/2023)   Exercise Vital Sign    Days of Exercise per Week: 0 days    Minutes of Exercise per Session: 0 min  Stress: No Stress Concern Present (03/05/2023)   [employer] of Occupational Health - Occupational Stress Questionnaire    Feeling of Stress : Not at all  Social Connections: Moderately Integrated (03/05/2023)   Social Connection and Isolation Panel [NHANES]    Frequency of Communication with Friends and Family: More than three times a  week    Frequency of Social Gatherings with Friends and Family: Once a week    Attends Religious Services: More than 4 times per year    Active Member of [employer] or Organizations: Yes    Attends Banker Meetings: 1 to 4 times per year    Marital Status: Widowed    Tobacco Counseling Counseling given: Not Answered Tobacco comments: social smoker - quit 60+ years ago  Clinical Intake:    Pain : No/denies pain   BMI - recorded: 32.27 Nutritional Status: BMI > 30  Obese Nutritional Risks: None Diabetes: No  How often do you need to have someone help you when you read instructions, pamphlets, or other written materials from your doctor or pharmacy?: 1 - Never  Diabetic?no  Interpreter Needed?: No  Comments: [place] Information entered by :: B.Shilee Biggs,LPN   Activities of Daily Living    03/05/2023    3:52 PM 10/11/2022   10:55 AM  In your present state of health, do you have any difficulty performing the following activities:  Hearing? 1 1  Vision? 0 0  Difficulty concentrating or making decisions? 1 1  Walking or climbing stairs? 1 1  Dressing or bathing? 0 0  Doing errands, shopping? 1 0  Comment does not drive much   Preparing Food and eating ? N   Using the Toilet? N   In the past six months, have you accidently leaked urine? N   Do you have problems with loss of bowel control? N   Managing your Medications? N   Managing your Finances? N   Housekeeping or managing your Housekeeping? N     Patient Care Team: Erasmo Downer, MD as PCP - General (Family Medicine) Marcina Millard, MD as Consulting Physician (Cardiology)  Indicate any recent Medical Services you may have received from other than [facility] providers in the past year (date may be approximate).     Assessment:   This is a routine wellness examination for [facility].  Hearing/Vision screen Hearing Screening - Comments:: Hearing impaired per pt..does not have  hearing aides Vision Screening - Comments:: Adequate vision w/glasses [facility]  Dietary issues and exercise activities discussed: Current Exercise Habits: The patient does not participate in regular exercise at present, Exercise limited by: neurologic condition(s);orthopedic condition(s)   Goals Addressed             This Visit's Progress    DIET - REDUCE PORTION SIZE   On track    Recommend to cut portions in half and to eat 3 small meals a day with two healthy snacks in between.       Depression Screen    03/05/2023    3:46 PM 10/11/2022   10:54 AM 04/09/2022    2:45 PM 02/08/2022    3:20 PM 06/08/2021  1:48 PM 08/02/2020   10:12 AM 07/30/2019   10:12 AM  PHQ 2/9 Scores  PHQ - 2 Score 0 0 0 0 0 0 0  PHQ- 9 Score  3 3  5  3     Fall Risk    03/05/2023    3:42 PM 10/11/2022   10:54 AM 04/09/2022    2:44 PM 06/08/2021    1:48 PM 08/02/2020   10:12 AM  Fall Risk   Falls in the past year? 0 0 0 0 0  Number falls in past yr: 0 0 0 0 0  Injury with Fall? 0 0 0 0 0  Risk for fall due to : No Fall Risks No Fall Risks No Fall Risks No Fall Risks Impaired balance/gait  Follow up Education provided;Falls prevention discussed Falls evaluation completed Falls evaluation completed Falls evaluation completed Falls evaluation completed    FALL RISK PREVENTION PERTAINING TO THE HOME:  Any stairs in or around the home? No  If so, are there any without handrails? No  Home free of loose throw rugs in walkways, pet beds, electrical cords, etc? Yes  Adequate lighting in your home to reduce risk of falls? Yes   ASSISTIVE DEVICES UTILIZED TO PREVENT FALLS:  Life alert? Yes  Use of a cane, walker or w/c? Yes  Grab bars in the bathroom? Yes  Shower chair or bench in shower? Yes  Elevated toilet seat or a handicapped toilet? Yes   Cognitive Function:        03/05/2023    3:56 PM 02/08/2022    3:20 PM 07/30/2019   10:13 AM  6CIT Screen  What Year? 0 points 0 points 0 points  What  month? 0 points 0 points 0 points  What time? 0 points 0 points 0 points  Count back from 20 0 points 0 points 0 points  Months in reverse 0 points 0 points 0 points  Repeat phrase 0 points 2 points 0 points  Total Score 0 points 2 points 0 points    Immunizations Immunization History  Administered Date(s) Administered   COVID-19, mRNA, vaccine(Comirnaty)12 years and older 08/09/2021   Fluad Quad(high Dose 65+) 07/30/2019, 08/02/2020, 07/29/2022   Influenza Split 09/16/2009, 09/05/2011   Influenza, High Dose Seasonal PF 07/12/2015, 07/18/2016, 07/26/2017, 08/09/2018   PFIZER Comirnaty(Gray Top)Covid-19 Tri-Sucrose Vaccine 11/04/2019, 12/02/2019, 08/04/2020, 03/15/2021   Pneumococcal Conjugate-13 07/12/2015   Pneumococcal Polysaccharide-23 08/30/1998   Zoster Recombinat (Shingrix) 05/11/2022    TDAP status: Up to date  Flu Vaccine status: Declined, Education has been provided regarding the importance of this vaccine but patient still declined. Advised may receive this vaccine at local pharmacy or Health Dept. Aware to provide a copy of the vaccination record if obtained from local pharmacy or Health Dept. Verbalized acceptance and understanding.  Pneumococcal vaccine status: Up to date  Covid-19 vaccine status: Declined, Education has been provided regarding the importance of this vaccine but patient still declined. Advised may receive this vaccine at local pharmacy or Health Dept.or vaccine clinic. Aware to provide a copy of the vaccination record if obtained from local pharmacy or Health Dept. Verbalized acceptance and understanding.  Qualifies for Shingles Vaccine? Yes   Zostavax completed No   Shingrix Completed?: No.    Education has been provided regarding the importance of this vaccine. Patient has been advised to call insurance company to determine out of pocket expense if they have not yet received this vaccine. Advised may also receive vaccine at local pharmacy or Health  Dept.  Verbalized acceptance and understanding.  Screening Tests Health Maintenance  Topic Date Due   DTaP/Tdap/Td (1 - Tdap) Never done   Zoster Vaccines- Shingrix (2 of 2) 07/06/2022   INFLUENZA VACCINE  05/30/2023   Medicare Annual Wellness (AWV)  03/04/2024   Pneumonia Vaccine 82+ Years old  Completed   DEXA SCAN  Completed   COVID-19 Vaccine  Completed   HPV VACCINES  Aged Out    Health Maintenance  Health Maintenance Due  Topic Date Due   DTaP/Tdap/Td (1 - Tdap) Never done   Zoster Vaccines- Shingrix (2 of 2) 07/06/2022    Colorectal cancer screening: No longer required.   Mammogram status: No longer required due to age.  Bone Density status: Completed yes. Results reflect: Bone density results: NORMAL. Repeat every - years.  Lung Cancer Screening: (Low Dose CT Chest recommended if Age 76-80 years, 30 pack-year currently smoking OR have quit w/in 15years.) does not qualify.   Lung Cancer Screening Referral: no  Additional Screening:  Hepatitis C Screening: does not qualify; Completed yes  Vision Screening: Recommended annual ophthalmology exams for early detection of glaucoma and other disorders of the eye. Is the patient up to date with their annual eye exam?  Yes  Who is the provider or what is the name of the office in which the patient attends annual eye exams? [facility] If pt is not established with a provider, would they like to be referred to a provider to establish care? No .   Dental Screening: Recommended annual dental exams for proper oral hygiene  Community Resource Referral / Chronic Care Management: CRR required this visit?  No   CCM required this visit?  No      Plan:     I have personally reviewed and noted the following in the patient's chart:   Medical and social history Use of alcohol, tobacco or illicit drugs  Current medications and supplements including opioid prescriptions. Patient is not currently taking opioid  prescriptions. Functional ability and status Nutritional status Physical activity Advanced directives List of other physicians Hospitalizations, surgeries, and ER visits in previous 12 months Vitals Screenings to include cognitive, depression, and falls Referrals and appointments  In addition, I have reviewed and discussed with patient certain preventive protocols, quality metrics, and best practice recommendations. A written personalized care plan for preventive services as well as general preventive health recommendations were provided to patient.    Sue Lush, LPN   11/03/1094   Nurse Notes: Pt lives in senior care complex (her own apt though) and is doing well. Pt indicates she is doing much better since gettinginjections and on Gabapentin. She rarely takes the Tyleno to supplement now. She ambulates with walker and uses scooter to go to meals (dining room) and activities. Pt requests paperwork for DNR.   *Mailed  DNR paperwork to pt.address verified

## 2023-03-12 ENCOUNTER — Encounter: Payer: Self-pay | Admitting: Family Medicine

## 2023-03-12 ENCOUNTER — Ambulatory Visit (INDEPENDENT_AMBULATORY_CARE_PROVIDER_SITE_OTHER): Payer: Medicare HMO | Admitting: Family Medicine

## 2023-03-12 VITALS — BP 122/57 | HR 74 | Temp 97.5°F | Wt 185.0 lb

## 2023-03-12 DIAGNOSIS — M4800 Spinal stenosis, site unspecified: Secondary | ICD-10-CM | POA: Diagnosis not present

## 2023-03-12 DIAGNOSIS — N3001 Acute cystitis with hematuria: Secondary | ICD-10-CM | POA: Diagnosis not present

## 2023-03-12 DIAGNOSIS — R350 Frequency of micturition: Secondary | ICD-10-CM

## 2023-03-12 LAB — POCT URINALYSIS DIPSTICK
Bilirubin, UA: NEGATIVE
Glucose, UA: NEGATIVE
Ketones, UA: NEGATIVE
Nitrite, UA: NEGATIVE
Protein, UA: POSITIVE — AB
Spec Grav, UA: 1.01 (ref 1.010–1.025)
Urobilinogen, UA: 0.2 E.U./dL
pH, UA: 6.5 (ref 5.0–8.0)

## 2023-03-12 MED ORDER — CEPHALEXIN 500 MG PO CAPS: 500.0000 mg | ORAL_CAPSULE | Freq: Two times a day (BID) | ORAL | 0 refills | Status: AC

## 2023-03-12 MED ORDER — NITROFURANTOIN MONOHYD MACRO 100 MG PO CAPS: 100.0000 mg | ORAL_CAPSULE | Freq: Two times a day (BID) | ORAL | 0 refills | Status: DC

## 2023-03-12 NOTE — Progress Notes (Unsigned)
I,Vanessa  Vital,acting as a Neurosurgeon for [employer], DO.,have documented all relevant documentation on the behalf of [employer], DO,as directed by  [employer], DO while in the presence of Ulrich Soules N Winfrey Chillemi, DO.    Established patient visit   Patient: Natalie Lucas   DOB: 03-11-1927   89 y.o. Female  MRN: [id number] Visit Date: 03/12/2023  Today's healthcare provider: Sherlyn Hay, DO   Chief Complaint  Patient presents with   Urinary Frequency   Subjective    HPI  Patient is a 89 year old female who presents with symptoms of urinary frequency and lower abdominal/pelvic pressure which have been ongoing for three days.  She denies pain, fever, chills and frank hematuria.  She states she has not had a UTI in several years. She has not yet treated her symptoms with any medication or supportive therapy.  She does have some urinary incontinence at baseline. Years ago, she had used pads for this regularly, but she stopped after realizing she was having frequent UTIs in relation to doing so.  She restarted wearing a pad at night several months ago.      Diagnosed with spinal stenosis several months ago. Was alternating advil and tylenol for five weeks.Now, only needs it a couple days a week in additional to the gabapentin.  Steroid shot 01/09/23 in both hips. Previously had frequent UTIs several years ago. Had restarted the pad at night several months ago.  Heat from low pelvis radiates up to shoulder down right arm and to fingertips, with urination. Every time for the past day.  Urologist told her bladder is prolapsed; never fully empties. Thinks bowel movements impact bladder function; had BM this morning, which relieved the pressure on her bladder.  Medications: Outpatient Medications Prior to Visit  Medication Sig   gabapentin (NEURONTIN) 100 MG capsule Take 100 mg by mouth 3 (three) times daily.   hydrochlorothiazide (MICROZIDE) 12.5 MG capsule TAKE 1 CAPSULE BY MOUTH  EVERY DAY   metoprolol tartrate (LOPRESSOR) 50 MG tablet TAKE ONE TABLET BY MOUTH TWICE DAILY   [DISCONTINUED] LIDODERM 5 % APPLY 1 PATCH BY TOPICAL ROUTE ONCE DAILY (MAY WEAR UP TO 12HOURS.) (Patient not taking: Reported on 03/05/2023)   No facility-administered medications prior to visit.    Review of Systems  Constitutional:  Negative for fever.  Genitourinary:  Positive for decreased urine volume, enuresis, frequency and urgency. Negative for difficulty urinating, dysuria, hematuria and pelvic pain.    {Labs  Heme  Chem  Endocrine  Serology  Results Review (optional):23779}   Objective    BP (!) 122/57 (BP Location: Right Arm, Patient Position: Sitting, Cuff Size: Normal)   Pulse 74   Temp (!) 97.5 F (36.4 C) (Oral)   Wt 185 lb (83.9 kg)   SpO2 95%   BMI 31.76 kg/m  {Show previous vital signs (optional):23777}  Physical Exam  ***  Results for orders placed or performed in visit on 03/12/23  POCT urinalysis dipstick  Result Value Ref Range   Color, UA yellow    Clarity, UA cloudy    Glucose, UA Negative Negative   Bilirubin, UA neg    Ketones, UA neg    Spec Grav, UA 1.010 1.010 - 1.025   Blood, UA 3+    pH, UA 6.5 5.0 - 8.0   Protein, UA Positive (A) Negative   Urobilinogen, UA 0.2 0.2 or 1.0 E.U./dL   Nitrite, UA neg    Leukocytes,  UA Large (3+) (A) Negative   Appearance     Odor      Assessment & Plan     ***  No follow-ups on file.      {provider attestation***:1}   Sherlyn Hay, DO  [facility] [phone] (phone) [phone] (fax)  [facility]

## 2023-03-13 DIAGNOSIS — M4800 Spinal stenosis, site unspecified: Secondary | ICD-10-CM | POA: Insufficient documentation

## 2023-03-15 LAB — URINE CULTURE

## 2023-03-15 NOTE — Progress Notes (Signed)
Urine culture showed Escherichia coli which is pansensitive, so there is no need to change the patient's antibiotic.

## 2023-03-21 ENCOUNTER — Telehealth: Payer: Self-pay | Admitting: Family Medicine

## 2023-04-15 ENCOUNTER — Encounter: Payer: Medicare HMO | Admitting: Family Medicine

## 2023-04-23 ENCOUNTER — Encounter: Payer: Self-pay | Admitting: Family Medicine

## 2023-04-23 ENCOUNTER — Ambulatory Visit (INDEPENDENT_AMBULATORY_CARE_PROVIDER_SITE_OTHER): Payer: Medicare HMO | Admitting: Family Medicine

## 2023-04-23 VITALS — BP 114/61 | HR 72 | Temp 97.6°F | Resp 16 | Wt 184.8 lb

## 2023-04-23 DIAGNOSIS — Z0000 Encounter for general adult medical examination without abnormal findings: Secondary | ICD-10-CM

## 2023-04-23 DIAGNOSIS — Z66 Do not resuscitate: Secondary | ICD-10-CM | POA: Diagnosis not present

## 2023-04-23 DIAGNOSIS — R7303 Prediabetes: Secondary | ICD-10-CM

## 2023-04-23 DIAGNOSIS — I10 Essential (primary) hypertension: Secondary | ICD-10-CM

## 2023-04-23 NOTE — Assessment & Plan Note (Signed)
Recommend low carb diet °Recheck A1c  °

## 2023-04-23 NOTE — Assessment & Plan Note (Signed)
Well controlled Continue current medications Recheck metabolic panel F/u in 6 months  

## 2023-04-23 NOTE — Progress Notes (Signed)
I,Natalie Lucas,acting as a Neurosurgeon for Natalie Latch, MD.,have documented all relevant documentation on the behalf of Natalie Latch, MD,as directed by  Natalie Latch, MD while in the presence of Natalie Latch, MD.    Complete physical exam   Patient: Natalie Lucas   DOB: 1927/01/29   89 y.o. Female  MRN: [id number] Visit Date: 04/23/2023  Today's healthcare provider: Shirlee Latch, MD   Chief Complaint  Patient presents with   Annual Exam   Subjective    Natalie Lucas is a 89 y.o. female who presents today for a complete physical exam.   She reports consuming a general diet. The patient does not participate in regular exercise at present. She generally feels well. She reports sleeping well. She does not have additional problems to discuss today.   HPI    Discussed the use of AI scribe software for clinical note transcription with the patient, who gave verbal consent to proceed.  History of Present Illness   The patient presents for a physical and to discuss wellness. She has been taking gabapentin 100mg  twice a day for pain related to stenosis. She had a steroid shot in March which helped with the pain. Prior to the shot, she was taking three gabapentin a day and supplementing with Advil and Tylenol. She expresses concern about the amount of medication she is taking but acknowledges that the pain is much better.  The patient also has a skin growth that has been present for several months. She is unsure of what it is and expresses interest in having it looked at by a dermatologist.  The patient also wishes to fill out a DNR form and discusses this with the doctor. She expresses a desire not to be kept alive artificially and does not want a feeding tube.       Past Medical History:  Diagnosis Date   Hyperlipidemia    Hypertension    Past Surgical History:  Procedure Laterality Date   BREAST SURGERY Right 08/2002   Ductal Hyperplasia   COSMETIC SURGERY   1993   JOINT REPLACEMENT Left    Left knee replacement.   KNEE SURGERY Left 2002   Social History   Socioeconomic History   Marital status: Widowed    Spouse name: Not on file   Number of children: 2   Years of education: College   Highest education level: Bachelor's degree (e.g., BA, AB, BS)  Occupational History   Occupation: Retired  Tobacco Use   Smoking status: Former   Smokeless tobacco: Never   Tobacco comments:    social smoker - quit 60+ years ago  Vaping Use   Vaping Use: Never used  Substance and Sexual Activity   Alcohol use: No   Drug use: No   Sexual activity: Not on file  Other Topics Concern   Not on file  Social History Narrative   Not on file   Social Determinants of Health   Financial Resource Strain: Low Risk  (03/05/2023)   Overall Financial Resource Strain (CARDIA)    Difficulty of Paying Living Expenses: Not hard at all  Food Insecurity: No Food Insecurity (03/05/2023)   Hunger Vital Sign    Worried About Running Out of Food in the Last Year: Never true    Ran Out of Food in the Last Year: Never true  Transportation Needs: No Transportation Needs (03/05/2023)   PRAPARE - Transportation    Lack of Transportation (Medical): No    Lack of  Transportation (Non-Medical): No  Physical Activity: Inactive (03/05/2023)   Exercise Vital Sign    Days of Exercise per Week: 0 days    Minutes of Exercise per Session: 0 min  Stress: No Stress Concern Present (03/05/2023)   [employer] of Occupational Health - Occupational Stress Questionnaire    Feeling of Stress : Not at all  Social Connections: Moderately Integrated (03/05/2023)   Social Connection and Isolation Panel [NHANES]    Frequency of Communication with Friends and Family: More than three times a week    Frequency of Social Gatherings with Friends and Family: Once a week    Attends Religious Services: More than 4 times per year    Active Member of [employer] or Organizations: Yes    Attends Tax inspector Meetings: 1 to 4 times per year    Marital Status: Widowed  Intimate Partner Violence: Not At Risk (03/05/2023)   Humiliation, Afraid, Rape, and Kick questionnaire    Fear of Current or Ex-Partner: No    Emotionally Abused: No    Physically Abused: No    Sexually Abused: No   Family Status  Relation Name Status   Mother  Deceased at age 45   Father  Deceased at age 42       lung cancer   Pat Uncle  Deceased   PGF  Deceased   Cousin  Deceased   Family History  Problem Relation Age of Onset   Dementia Mother    Transient ischemic attack Mother    Lung cancer Father    Cancer Paternal Uncle    Cancer Paternal Grandfather    Melanoma Cousin    Allergies  Allergen Reactions   Ace Inhibitors Cough   Aspirin     Bleeding ulcers.   Salicylates     Other reaction(s): Unknown    Patient Care Team: Erasmo Downer, MD as PCP - General (Family Medicine) Marcina Millard, MD as Consulting Physician (Cardiology)   Medications: Outpatient Medications Prior to Visit  Medication Sig   gabapentin (NEURONTIN) 100 MG capsule Take 100 mg by mouth 2 (two) times daily.   hydrochlorothiazide (MICROZIDE) 12.5 MG capsule TAKE 1 CAPSULE BY MOUTH EVERY DAY   metoprolol tartrate (LOPRESSOR) 50 MG tablet TAKE ONE TABLET BY MOUTH TWICE DAILY   No facility-administered medications prior to visit.    Review of Systems  HENT:  Positive for hearing loss and postnasal drip.   Musculoskeletal:  Positive for joint swelling.      Objective    BP 114/61 (BP Location: Right Arm, Patient Position: Sitting, Cuff Size: Large)   Pulse 72   Temp 97.6 F (36.4 C) (Temporal)   Resp 16   Wt 184 lb 12.8 oz (83.8 kg)   SpO2 97%   BMI 31.72 kg/m     Physical Exam Vitals reviewed.  Constitutional:      General: She is not in acute distress.    Appearance: Normal appearance. She is well-developed. She is not diaphoretic.  HENT:     Head: Normocephalic and atraumatic.      Right Ear: Tympanic membrane, ear canal and external ear normal.     Left Ear: Tympanic membrane, ear canal and external ear normal.     Nose: Nose normal.     Mouth/Throat:     Mouth: Mucous membranes are moist.     Pharynx: Oropharynx is clear. No oropharyngeal exudate.  Eyes:     General: No scleral icterus.    Conjunctiva/sclera:  Conjunctivae normal.     Pupils: Pupils are equal, round, and reactive to light.  Neck:     Thyroid: No thyromegaly.  Cardiovascular:     Rate and Rhythm: Normal rate and regular rhythm.     Heart sounds: Murmur heard.  Pulmonary:     Effort: Pulmonary effort is normal. No respiratory distress.     Breath sounds: Normal breath sounds. No wheezing or rales.  Abdominal:     General: There is no distension.     Palpations: Abdomen is soft.     Tenderness: There is no abdominal tenderness.  Musculoskeletal:        General: No deformity.     Cervical back: Neck supple.     Right lower leg: No edema.     Left lower leg: No edema.  Lymphadenopathy:     Cervical: No cervical adenopathy.  Skin:    General: Skin is warm and dry.     Findings: No rash.     Comments: Crusted lesion on L arm  Neurological:     Mental Status: She is alert and oriented to person, place, and time. Mental status is at baseline.     Gait: Gait normal.  Psychiatric:        Mood and Affect: Mood normal.        Behavior: Behavior normal.        Thought Content: Thought content normal.       Last depression screening scores    04/23/2023    2:17 PM 03/05/2023    3:46 PM 10/11/2022   10:54 AM  PHQ 2/9 Scores  PHQ - 2 Score 0 0 0  PHQ- 9 Score 0  3   Last fall risk screening    04/23/2023    2:17 PM  Fall Risk   Falls in the past year? 0  Number falls in past yr: 0  Injury with Fall? 0  Risk for fall due to : No Fall Risks  Follow up Falls evaluation completed   Last Audit-C alcohol use screening    04/23/2023    2:18 PM  Alcohol Use Disorder Test (AUDIT)  1. How  often do you have a drink containing alcohol? 0  2. How many drinks containing alcohol do you have on a typical day when you are drinking? 0  3. How often do you have six or more drinks on one occasion? 0  AUDIT-C Score 0   A score of 3 or more in women, and 4 or more in men indicates increased risk for alcohol abuse, EXCEPT if all of the points are from question 1   No results found for any visits on 04/23/23.  Assessment & Plan    Routine Health Maintenance and Physical Exam  Exercise Activities and Dietary recommendations  Goals      DIET - REDUCE PORTION SIZE     Recommend to cut portions in half and to eat 3 small meals a day with two healthy snacks in between.        Immunization History  Administered Date(s) Administered   COVID-19, mRNA, vaccine(Comirnaty)12 years and older 08/09/2021   Fluad Quad(high Dose 65+) 07/30/2019, 08/02/2020, 07/29/2022   Influenza Split 09/16/2009, 09/05/2011   Influenza, High Dose Seasonal PF 07/12/2015, 07/18/2016, 07/26/2017, 08/09/2018   PFIZER Comirnaty(Gray Top)Covid-19 Tri-Sucrose Vaccine 11/04/2019, 12/02/2019, 08/04/2020, 03/15/2021   Pneumococcal Conjugate-13 07/12/2015   Pneumococcal Polysaccharide-23 08/30/1998   Zoster Recombinat (Shingrix) 05/11/2022    Health Maintenance  Topic Date  Due   DTaP/Tdap/Td (1 - Tdap) Never done   Zoster Vaccines- Shingrix (2 of 2) 07/06/2022   INFLUENZA VACCINE  05/30/2023   Medicare Annual Wellness (AWV)  03/04/2024   Pneumonia Vaccine 13+ Years old  Completed   DEXA SCAN  Completed   COVID-19 Vaccine  Completed   HPV VACCINES  Aged Out    Discussed health benefits of physical activity, and encouraged her to engage in regular exercise appropriate for her age and condition.  Problem List Items Addressed This Visit       Cardiovascular and Mediastinum   Essential hypertension    Well controlled Continue current medications Recheck metabolic panel F/u in 6 months       Relevant  Orders   Lipid panel   Comprehensive metabolic panel     Other   Prediabetes    Recommend low carb diet Recheck A1c       Relevant Orders   Hemoglobin A1c   Other Visit Diagnoses     Encounter for annual physical exam    -  Primary   Relevant Orders   Hemoglobin A1c   Lipid panel   Comprehensive metabolic panel   DNR (do not resuscitate)       Relevant Orders   Do not attempt resuscitation (DNR)            Lumbar Spinal Stenosis: Pain improved after steroid injection and Gabapentin 100mg  twice daily. Patient expressed concern about medication use, but understands the importance of pain management for quality of life. -Continue Gabapentin 100mg  twice daily.  Skin Lesion: New skin lesion on arm present for several months. No acute changes noted. -Refer to dermatologist for evaluation and possible biopsy.  Advance Care Planning: Patient expressed desire for DNR and discussed various aspects of care in the event of serious illness. -Completed DNR form and MOST form. Advised patient to place forms in a visible location at home.  General Health Maintenance: -Order labs including A1C, cholesterol, kidney and liver function. -Advise patient to get Tetanus shot at pharmacy. -Schedule follow-up appointment in six months.       Return in about 6 months (around 10/23/2023) for chronic disease f/u.     I, Natalie Latch, MD, have reviewed all documentation for this visit. The documentation on 04/23/23 for the exam, diagnosis, procedures, and orders are all accurate and complete.   Theadora Noyes, Marzella Schlein, MD, MPH [facility]

## 2023-04-24 LAB — COMPREHENSIVE METABOLIC PANEL
ALT: 10 IU/L (ref 0–32)
AST: 16 IU/L (ref 0–40)
Albumin: 3.7 g/dL (ref 3.6–4.6)
Alkaline Phosphatase: 118 IU/L (ref 44–121)
BUN/Creatinine Ratio: 25 (ref 12–28)
BUN: 23 mg/dL (ref 10–36)
Bilirubin Total: <0.2 mg/dL (ref 0.0–1.2)
CO2: 25 mmol/L (ref 20–29)
Calcium: 9 mg/dL (ref 8.7–10.3)
Chloride: 100 mmol/L (ref 96–106)
Creatinine, Ser: 0.92 mg/dL (ref 0.57–1.00)
Globulin, Total: 2.9 g/dL (ref 1.5–4.5)
Glucose: 114 mg/dL — ABNORMAL HIGH (ref 70–99)
Potassium: 4.5 mmol/L (ref 3.5–5.2)
Sodium: 139 mmol/L (ref 134–144)
Total Protein: 6.6 g/dL (ref 6.0–8.5)
eGFR: 58 mL/min/{1.73_m2} — ABNORMAL LOW (ref >59)

## 2023-04-24 LAB — LIPID PANEL
Chol/HDL Ratio: 4 ratio (ref 0.0–4.4)
Cholesterol, Total: 253 mg/dL — ABNORMAL HIGH (ref 100–199)
HDL: 64 mg/dL (ref >39)
LDL Chol Calc (NIH): 166 mg/dL — ABNORMAL HIGH (ref 0–99)
Triglycerides: 127 mg/dL (ref 0–149)
VLDL Cholesterol Cal: 23 mg/dL (ref 5–40)

## 2023-04-24 LAB — HEMOGLOBIN A1C
Est. average glucose Bld gHb Est-mCnc: 148 mg/dL
Hgb A1c MFr Bld: 6.8 % — ABNORMAL HIGH (ref 4.8–5.6)

## 2023-06-06 ENCOUNTER — Telehealth: Payer: Self-pay

## 2023-06-06 NOTE — Transitions of Care (Post Inpatient/ED Visit) (Signed)
   06/06/2023  Name: Natalie Lucas MRN: [id number] DOB: 1927-10-07  Today's TOC FU Call Status: Today's TOC FU Call Status:: Successful TOC FU Call Completed TOC FU Call Complete Date: 06/06/23  Transition Care Management Follow-up Telephone Call Date of Discharge: 06/05/23 Discharge Facility: Other ([facility]) Name of Other ([facility]) Discharge Facility: [facility] Type of Discharge: Inpatient Admission Primary Inpatient Discharge Diagnosis:: PE How have you been since you were released from the hospital?: Better Any questions or concerns?: No  Items Reviewed: Did you receive and understand the discharge instructions provided?: Yes Medications obtained,verified, and reconciled?: Yes (Medications Reviewed) Any new allergies since your discharge?: No Dietary orders reviewed?: Yes Do you have support at home?: Yes People in Home: facility resident  Medications Reviewed Today: Medications Reviewed Today     Reviewed by Karena Addison, LPN (Licensed Practical Nurse) on 06/06/23 at 0930  Med List Status: <None>   Medication Order Taking? Sig Documenting Provider Last Dose Status Informant  apixaban (ELIQUIS) 5 MG TABS tablet [id number] Yes Take 5 mg by mouth 2 (two) times daily. [provider] Taking Active   gabapentin (NEURONTIN) 100 MG capsule [id number] Yes Take 100 mg by mouth 2 (two) times daily. [provider] Taking Active   hydrochlorothiazide (MICROZIDE) 12.5 MG capsule [id number] Yes TAKE 1 CAPSULE BY MOUTH EVERY DAY Bacigalupo, Marzella Schlein, MD Taking Active   metoprolol tartrate (LOPRESSOR) 50 MG tablet [id number] No TAKE ONE TABLET BY MOUTH TWICE DAILY  Patient not taking: Reported on 06/06/2023   Erasmo Downer, MD Not Taking Active             Home Care and Equipment/Supplies: Were Home Health Services Ordered?: Yes Name of Home Health Agency:: unknown Any new equipment or medical supplies ordered?: Yes Name of Medical supply agency?:  [facility] Were you able to get the equipment/medical supplies?: Yes Do you have any questions related to the use of the equipment/supplies?: No  Functional Questionnaire: Do you need assistance with bathing/showering or dressing?: No Do you need assistance with meal preparation?: No Do you need assistance with eating?: No Do you have difficulty maintaining continence: No Do you need assistance with getting out of bed/getting out of a chair/moving?: No Do you have difficulty managing or taking your medications?: No  Follow up appointments reviewed: PCP Follow-up appointment confirmed?: Yes Date of PCP follow-up appointment?: 06/11/23 Follow-up Provider: [facility] Follow-up appointment confirmed?: NA Do you need transportation to your follow-up appointment?: No Do you understand care options if your condition(s) worsen?: Yes-patient verbalized understanding    SIGNATURE Karena Addison, LPN [facility] Nurse Health Advisor Direct Dial [phone]

## 2023-06-11 ENCOUNTER — Encounter: Payer: Self-pay | Admitting: Family Medicine

## 2023-06-11 ENCOUNTER — Ambulatory Visit (INDEPENDENT_AMBULATORY_CARE_PROVIDER_SITE_OTHER): Payer: Medicare HMO | Admitting: Family Medicine

## 2023-06-11 VITALS — BP 131/77 | HR 103 | Temp 98.3°F | Resp 12 | Ht 64.0 in | Wt 182.3 lb

## 2023-06-11 DIAGNOSIS — D649 Anemia, unspecified: Secondary | ICD-10-CM | POA: Diagnosis not present

## 2023-06-11 DIAGNOSIS — I2699 Other pulmonary embolism without acute cor pulmonale: Secondary | ICD-10-CM

## 2023-06-11 DIAGNOSIS — I10 Essential (primary) hypertension: Secondary | ICD-10-CM | POA: Diagnosis not present

## 2023-06-11 DIAGNOSIS — I480 Paroxysmal atrial fibrillation: Secondary | ICD-10-CM

## 2023-06-11 DIAGNOSIS — R0902 Hypoxemia: Secondary | ICD-10-CM | POA: Diagnosis not present

## 2023-06-11 DIAGNOSIS — R600 Localized edema: Secondary | ICD-10-CM

## 2023-06-11 NOTE — Assessment & Plan Note (Signed)
Anemia noted in hospital  Continue taking Ferrous sulfate  Recheck CBC

## 2023-06-11 NOTE — Assessment & Plan Note (Addendum)
Acute Resolved  On ppx anticoagulation with 5 mg of Eliquis

## 2023-06-11 NOTE — Assessment & Plan Note (Addendum)
Acute  Related to bilateral PE  Discharged on oxygen  Currently on 2L Oxygen via [place]  Recommended to wean O2 as tolerated

## 2023-06-11 NOTE — Assessment & Plan Note (Signed)
Chronic  Hydrochlorothiazide being held may be contributing  Prescription for compression stockings with zippers  Recommended leg elevation and low salt intake

## 2023-06-11 NOTE — Assessment & Plan Note (Addendum)
Maintenance medications for hypertension are hydrochlorothiazide and metoprolol  These were held on discharge due to right heart strain  BP in office was nml at 131 / 77  Continue to hold BP medications  Take at home BP readings and contact office when BP is around 140 / 90, will give advice about which BP medications to resume once this occurs Recheck CMP

## 2023-06-11 NOTE — Progress Notes (Signed)
Established Patient Office Visit  Subjective   Patient ID: Natalie Lucas, female    DOB: 1926/12/04  Age: 89 y.o. MRN: [id number]  Chief Complaint  Patient presents with   Hospitalization Follow-up    Admitted to [facility] 06/01/23 to 06/05/23 patient had pulmonary embolism on 06/01/23.     Natalie Lucas is here today for a hospital follow up.  She was recently admitted at [facility] for bilateral pulmonary embolism with evidence of R heart strain.  Since hospital admission she has been feeling as okay as expected. She still feels run down compared to normal. She is still experiencing some SOB with exertion only. She also had the return of bilateral LE edema.  She was discharged on Oxygen. She is currently on 2 L of Oxygen.  Her BP medications (hydrochlorothiazide and beta blocker) was held on discharge. She is currently taking Eliquis. Home health / PT were contacted and following up with [facility]. TOC call was on 06/06/23.     Patient Active Problem List   Diagnosis Date Noted   Bilateral pulmonary embolism (HCC) 06/11/2023   Hypoxia 06/11/2023   Paroxysmal atrial fibrillation (HCC) 06/11/2023   Anemia 06/11/2023   Spinal stenosis 03/13/2023   Venous stasis 04/09/2022   Pedal edema 11/03/2019   Prediabetes 03/11/2018   Weakness generalized 11/18/2017   Degeneration of intervertebral disc at C4-C5 level 06/21/2017   Atrial flutter (HCC) 07/12/2015   Bleeding ulcer 07/12/2015   Acid reflux 07/12/2015   History of urinary anomaly 07/12/2015   Essential hypertension 07/12/2015   Scoliosis 07/12/2015   Arthritis, degenerative 07/12/2015   OP (osteoporosis) 07/12/2015   Cervical pain 07/12/2015   Avitaminosis D 07/12/2015   Hyperlipemia 05/30/2015   Breathlessness on exertion 06/01/2014      Review of Systems  Constitutional:  Positive for malaise/fatigue.  Respiratory:  Positive for shortness of breath. Negative for cough.   Cardiovascular:  Positive for leg swelling. Negative for palpitations.       Objective:     BP 131/77 (BP Location: Left Arm, Patient Position: Sitting, Cuff Size: Large)   Pulse (!) 103   Temp 98.3 F (36.8 C) (Temporal)   Resp 12   Ht 5\' 4"  (1.626 m)   Wt 182 lb 4.8 oz (82.7 kg)   SpO2 99%   BMI 31.29 kg/m  BP Readings from Last 3 Encounters:  06/11/23 131/77  04/23/23 114/61  03/12/23 (!) 122/57      Physical Exam Constitutional:      Appearance: Normal appearance.  Cardiovascular:     Rate and Rhythm: Normal rate and regular rhythm.     Heart sounds: Normal heart sounds.  Pulmonary:     Effort: Pulmonary effort is normal.     Breath sounds: Normal breath sounds.  Musculoskeletal:     Right lower leg: Edema present.     Left lower leg: Edema present.  Neurological:     General: No focal deficit present.     Mental Status: She is alert and oriented to person, place, and time.      No results found for any visits on 06/11/23.  Last CBC Lab Results  Component Value Date   WBC 5.1 06/08/2021   HGB 12.5 06/08/2021   HCT 37.0 06/08/2021   MCV 89 06/08/2021   MCH 30.1 06/08/2021   RDW 12.9 06/08/2021   PLT 252 06/08/2021   Last metabolic panel Lab Results  Component Value Date   GLUCOSE 114 (H) 04/23/2023   NA 139  04/23/2023   K 4.5 04/23/2023   CL 100 04/23/2023   CO2 25 04/23/2023   BUN 23 04/23/2023   CREATININE 0.92 04/23/2023   EGFR 58 (L) 04/23/2023   CALCIUM 9.0 04/23/2023   PROT 6.6 04/23/2023   ALBUMIN 3.7 04/23/2023   LABGLOB 2.9 04/23/2023   AGRATIO 1.3 10/11/2022   BILITOT <0.2 04/23/2023   ALKPHOS 118 04/23/2023   AST 16 04/23/2023   ALT 10 04/23/2023   Last lipids Lab Results  Component Value Date   CHOL 253 (H) 04/23/2023   HDL 64 04/23/2023   LDLCALC 166 (H) 04/23/2023   TRIG 127 04/23/2023   CHOLHDL 4.0 04/23/2023      The ASCVD Risk score (Arnett DK, et al., 2019) failed to calculate for the following reasons:   The 2019 ASCVD risk score is only valid for ages 23 to 68    Assessment  & Plan:   Problem List Items Addressed This Visit       Cardiovascular and Mediastinum   Essential hypertension - Primary    Maintenance medications for hypertension are hydrochlorothiazide and metoprolol  These were held on discharge due to right heart strain  BP in office was nml at 131 / 77  Continue to hold BP medications  Take at home BP readings and contact office when BP is around 140 / 90, will give advice about which BP medications to resume once this occurs Recheck CMP      Relevant Orders   Comprehensive Metabolic Panel (CMET)   Bilateral pulmonary embolism (HCC)    Acute Resolved  On ppx anticoagulation with 5 mg of Eliquis       Paroxysmal atrial fibrillation (HCC)    New  Was in Afib while in the hospital  Has previous history of Atrial flutter  Referral to cardiology       Relevant Orders   Ambulatory referral to Cardiology     Respiratory   Hypoxia    Acute  Related to bilateral PE  Discharged on oxygen  Currently on 2L Oxygen via [place]  Recommended to wean O2 as tolerated        Other   Pedal edema    Chronic  Hydrochlorothiazide being held may be contributing  Prescription for compression stockings with zippers  Recommended leg elevation and low salt intake       Anemia    Anemia noted in hospital  Continue taking Ferrous sulfate  Recheck CBC      Relevant Medications   ferrous sulfate 324 MG TBEC   Other Relevant Orders   CBC   Comprehensive Metabolic Panel (CMET)    Return in about 6 weeks (around 07/23/2023) for chronic disease f/u.    Rometta Emery, Medical Student  Patient seen along with MS3 student Jodi Marble. I personally evaluated this patient along with the student, and verified all aspects of the history, physical exam, and medical decision making as documented by the student. I agree with the student's documentation and have made all necessary edits.  Zarius Furr, Marzella Schlein, MD, MPH [facility]

## 2023-06-11 NOTE — Assessment & Plan Note (Signed)
New  Was in Afib while in the hospital  Has previous history of Atrial flutter  Referral to cardiology

## 2023-06-13 ENCOUNTER — Encounter: Payer: Self-pay | Admitting: Family Medicine

## 2023-07-18 ENCOUNTER — Ambulatory Visit (INDEPENDENT_AMBULATORY_CARE_PROVIDER_SITE_OTHER): Payer: Medicare HMO | Admitting: Family Medicine

## 2023-07-18 ENCOUNTER — Encounter: Payer: Self-pay | Admitting: Family Medicine

## 2023-07-18 ENCOUNTER — Telehealth: Payer: Self-pay

## 2023-07-18 VITALS — BP 120/72 | HR 92 | Ht 65.0 in | Wt 182.7 lb

## 2023-07-18 DIAGNOSIS — I10 Essential (primary) hypertension: Secondary | ICD-10-CM | POA: Diagnosis not present

## 2023-07-18 DIAGNOSIS — E785 Hyperlipidemia, unspecified: Secondary | ICD-10-CM | POA: Diagnosis not present

## 2023-07-18 DIAGNOSIS — E559 Vitamin D deficiency, unspecified: Secondary | ICD-10-CM

## 2023-07-18 DIAGNOSIS — I4892 Unspecified atrial flutter: Secondary | ICD-10-CM | POA: Diagnosis not present

## 2023-07-18 DIAGNOSIS — D649 Anemia, unspecified: Secondary | ICD-10-CM

## 2023-07-18 DIAGNOSIS — G478 Other sleep disorders: Secondary | ICD-10-CM | POA: Insufficient documentation

## 2023-07-18 DIAGNOSIS — I480 Paroxysmal atrial fibrillation: Secondary | ICD-10-CM | POA: Diagnosis not present

## 2023-07-18 DIAGNOSIS — G4733 Obstructive sleep apnea (adult) (pediatric): Secondary | ICD-10-CM | POA: Insufficient documentation

## 2023-07-18 DIAGNOSIS — R7303 Prediabetes: Secondary | ICD-10-CM

## 2023-07-18 NOTE — Assessment & Plan Note (Signed)
Reviewed last lipid panel

## 2023-07-18 NOTE — Assessment & Plan Note (Signed)
Blood pressure well controlled on Losartan 25mg  daily. Previous medications (Hydrochlorothiazide and Metoprolol) were held due to right heart strain from pulmonary embolism. No current side effects from Losartan reported. -Continue Losartan 25mg  daily.

## 2023-07-18 NOTE — Assessment & Plan Note (Addendum)
F/b Cardiology In NSR today Continue eliquis

## 2023-07-18 NOTE — Progress Notes (Signed)
Established Patient Office Visit  Subjective   Patient ID: Natalie Lucas, female    DOB: September 18, 1927  Age: 89 y.o. MRN: [id number]  Chief Complaint  Patient presents with   Follow-up    BP changing on new medication    HPI  Discussed the use of AI scribe software for clinical note transcription with the patient, who gave verbal consent to proceed.  History of Present Illness   The patient, with a history of hypertension and recent hospitalization for pulmonary embolisms, presents for follow-up. The patient's hypertension was previously managed with hydrochlorothiazide and metoprolol, but these were stopped due to right heart strain. The patient has been monitoring her blood pressure at home and has noticed it to be higher than usual, causing some concern. The patient was started on losartan 25mg  daily by her cardiologist and has noticed some changes in her handwriting and vision since starting this medication. The patient also has a history of anemia and is currently on iron supplements, which have caused some constipation. The patient uses oxygen at night and has a history of snoring, raising the possibility of sleep apnea.         ROS per HPI    Objective:     BP 120/72 (BP Location: Right Arm, Patient Position: Sitting, Cuff Size: Normal)   Pulse 92   Ht 5\' 5"  (1.651 m)   Wt 182 lb 11.2 oz (82.9 kg)   SpO2 93%   BMI 30.40 kg/m    Physical Exam Vitals reviewed.  Constitutional:      General: She is not in acute distress.    Appearance: Normal appearance. She is well-developed. She is not diaphoretic.  HENT:     Head: Normocephalic and atraumatic.  Eyes:     General: No scleral icterus.    Conjunctiva/sclera: Conjunctivae normal.  Neck:     Thyroid: No thyromegaly.  Cardiovascular:     Rate and Rhythm: Normal rate and regular rhythm.     Heart sounds: Normal heart sounds. No murmur heard. Pulmonary:     Effort: Pulmonary effort is normal. No respiratory distress.      Breath sounds: Normal breath sounds. No wheezing, rhonchi or rales.  Musculoskeletal:     Cervical back: Neck supple.     Right lower leg: No edema.     Left lower leg: No edema.  Lymphadenopathy:     Cervical: No cervical adenopathy.  Skin:    General: Skin is warm and dry.     Findings: No rash.  Neurological:     Mental Status: She is alert and oriented to person, place, and time. Mental status is at baseline.  Psychiatric:        Mood and Affect: Mood normal.        Behavior: Behavior normal.      No results found for any visits on 07/18/23.    The ASCVD Risk score (Arnett DK, et al., 2019) failed to calculate for the following reasons:   The 2019 ASCVD risk score is only valid for ages 92 to 48    Assessment & Plan:   Problem List Items Addressed This Visit       Cardiovascular and Mediastinum   Atrial flutter (HCC)    F/b Cardiology In NSR today Continue eliquis      Relevant Medications   losartan (COZAAR) 25 MG tablet   Essential hypertension - Primary    Blood pressure well controlled on Losartan 25mg  daily. Previous medications (Hydrochlorothiazide  and Metoprolol) were held due to right heart strain from pulmonary embolism. No current side effects from Losartan reported. -Continue Losartan 25mg  daily.      Relevant Medications   losartan (COZAAR) 25 MG tablet   Other Relevant Orders   Comprehensive metabolic panel   Paroxysmal atrial fibrillation (HCC)    F/b Cardiology In NSR today Continue eliquis      Relevant Medications   losartan (COZAAR) 25 MG tablet     Nervous and Auditory   Non-restorative sleep    Patient reports waking up tired for the past 10 years, a potential sign of sleep apnea. -Order home sleep study to evaluate for sleep apnea.      Relevant Orders   Ambulatory referral to Sleep Studies     Other   Hyperlipemia    Reviewed last lipid panel      Relevant Medications   losartan (COZAAR) 25 MG tablet    Avitaminosis D   Prediabetes    Recommend low carb diet Recheck A1c       Relevant Orders   Hemoglobin A1c   Anemia    Mild anemia noted with hemoglobin of 10.7 (normal >12). Improvement from previous level of 10.2. Patient is currently taking iron supplements. -Check complete blood count today to monitor anemia. -Continue iron supplementation as tolerated.      Relevant Orders   CBC w/Diff/Platelet        Constipation Likely secondary to iron supplementation. Patient is currently using Dulcolax for relief. -Continue Dulcolax as needed for constipation.  General Health Maintenance -Check potassium level today due to potential for hyperkalemia with Losartan use. -Check A1c today to monitor for prediabetes. -Follow-up appointment in early December.        Return in about 3 months (around 10/17/2023) for chronic disease f/u.    Shirlee Latch, MD

## 2023-07-18 NOTE — Telephone Encounter (Signed)
Copied from CRM [id number]. Topic: General - Other >> Jul 18, 2023 10:07 AM Macon Large wrote: Reason for CRM: Caller stated they are running behind but on their way for the appt. Caller informed of late policy

## 2023-07-18 NOTE — Telephone Encounter (Signed)
noted 

## 2023-07-18 NOTE — Assessment & Plan Note (Signed)
Recommend low carb diet °Recheck A1c  °

## 2023-07-18 NOTE — Assessment & Plan Note (Signed)
Patient reports waking up tired for the past 10 years, a potential sign of sleep apnea. -Order home sleep study to evaluate for sleep apnea.

## 2023-07-18 NOTE — Assessment & Plan Note (Signed)
Mild anemia noted with hemoglobin of 10.7 (normal >12). Improvement from previous level of 10.2. Patient is currently taking iron supplements. -Check complete blood count today to monitor anemia. -Continue iron supplementation as tolerated.

## 2023-07-19 LAB — COMPREHENSIVE METABOLIC PANEL
ALT: 12 IU/L (ref 0–32)
AST: 19 IU/L (ref 0–40)
Albumin: 3.5 g/dL — ABNORMAL LOW (ref 3.6–4.6)
Alkaline Phosphatase: 101 IU/L (ref 44–121)
BUN/Creatinine Ratio: 13 (ref 12–28)
BUN: 11 mg/dL (ref 10–36)
Bilirubin Total: 0.2 mg/dL (ref 0.0–1.2)
CO2: 23 mmol/L (ref 20–29)
Calcium: 9.1 mg/dL (ref 8.7–10.3)
Chloride: 101 mmol/L (ref 96–106)
Creatinine, Ser: 0.88 mg/dL (ref 0.57–1.00)
Globulin, Total: 2.8 g/dL (ref 1.5–4.5)
Glucose: 162 mg/dL — ABNORMAL HIGH (ref 70–99)
Potassium: 4.2 mmol/L (ref 3.5–5.2)
Sodium: 140 mmol/L (ref 134–144)
Total Protein: 6.3 g/dL (ref 6.0–8.5)
eGFR: 61 mL/min/{1.73_m2} (ref >59)

## 2023-07-19 LAB — HEMOGLOBIN A1C
Est. average glucose Bld gHb Est-mCnc: 128 mg/dL
Hgb A1c MFr Bld: 6.1 % — ABNORMAL HIGH (ref 4.8–5.6)

## 2023-07-19 LAB — CBC WITH DIFFERENTIAL/PLATELET
Basophils Absolute: 0 10*3/uL (ref 0.0–0.2)
Basos: 1 %
EOS (ABSOLUTE): 0.2 10*3/uL (ref 0.0–0.4)
Eos: 5 %
Hematocrit: 39.1 % (ref 34.0–46.6)
Hemoglobin: 12.5 g/dL (ref 11.1–15.9)
Immature Grans (Abs): 0 10*3/uL (ref 0.0–0.1)
Immature Granulocytes: 0 %
Lymphocytes Absolute: 0.9 10*3/uL (ref 0.7–3.1)
Lymphs: 24 %
MCH: 30.4 pg (ref 26.6–33.0)
MCHC: 32 g/dL (ref 31.5–35.7)
MCV: 95 fL (ref 79–97)
Monocytes Absolute: 0.4 10*3/uL (ref 0.1–0.9)
Monocytes: 10 %
Neutrophils Absolute: 2.4 10*3/uL (ref 1.4–7.0)
Neutrophils: 60 %
Platelets: 218 10*3/uL (ref 150–450)
RBC: 4.11 x10E6/uL (ref 3.77–5.28)
RDW: 14.9 % (ref 11.7–15.4)
WBC: 3.9 10*3/uL (ref 3.4–10.8)

## 2023-08-27 ENCOUNTER — Other Ambulatory Visit: Payer: Self-pay | Admitting: Family Medicine

## 2023-08-27 ENCOUNTER — Telehealth: Payer: Self-pay

## 2023-08-27 DIAGNOSIS — G4730 Sleep apnea, unspecified: Secondary | ICD-10-CM

## 2023-08-27 NOTE — Telephone Encounter (Signed)
Copied from CRM [id number]. Topic: General - Other >> Aug 27, 2023  3:02 PM Ja-Kwan M wrote: Reason for CRM: Pt requests call back to go over results of the sleep apnea test. Pt stated that she has stopped using the oxygen since doing the sleep study. Pt would like to know if she is no longer required to use the oxygen who should be contacted to order pick up of the tanks. Cb# [phone]

## 2023-08-28 NOTE — Telephone Encounter (Signed)
Requested Prescriptions  Pending Prescriptions Disp Refills   hydrochlorothiazide (MICROZIDE) 12.5 MG capsule [Pharmacy Med Name: HYDROCHLOROTHIAZIDE 12.5 MG CAP] 90 capsule 0    Sig: TAKE 1 CAPSULE BY MOUTH EVERY DAY     Cardiovascular: Diuretics - Thiazide Passed - 08/27/2023  1:53 PM      Passed - Cr in normal range and within 180 days    Creatinine, Ser  Date Value Ref Range Status  07/18/2023 0.88 0.57 - 1.00 mg/dL Final         Passed - K in normal range and within 180 days    Potassium  Date Value Ref Range Status  07/18/2023 4.2 3.5 - 5.2 mmol/L Final         Passed - Na in normal range and within 180 days    Sodium  Date Value Ref Range Status  07/18/2023 140 134 - 144 mmol/L Final         Passed - Last BP in normal range    BP Readings from Last 1 Encounters:  07/18/23 120/72         Passed - Valid encounter within last 6 months    Recent Outpatient Visits           1 month ago Essential hypertension   [facility] [place], Marzella Schlein, MD   2 months ago Essential hypertension   [facility] [place], Marzella Schlein, MD   4 months ago Encounter for annual physical exam   [facility] [place], Marzella Schlein, MD   5 months ago Acute cystitis with hematuria   [facility] [place], Sarah N, DO   10 months ago Essential hypertension   [facility] [place], Marzella Schlein, MD       Future Appointments             In 1 month Bacigalupo, Marzella Schlein, MD [facility]   In 2 months Bacigalupo, Marzella Schlein, MD [facility], [facility]

## 2023-08-29 NOTE — Telephone Encounter (Signed)
See result note.  

## 2023-08-30 NOTE — Telephone Encounter (Signed)
Natalie Downer, MD 08/29/2023  4:27 PM EDT Back to Top    Sleep study shows AHI of 47.3, which means that every hour on average she stopped breathing or dropped her oxygen levels 47 times.  This corresponds to severe sleep apnea. Recommend autopap with mask of patients choice and heated humidity and all necessary tubing and filters - please place orders.    Patient advised. Verbalized understanding. Community message sent to [employer], [place] [facility] and [place] in regards to Cpap order.

## 2023-09-02 ENCOUNTER — Other Ambulatory Visit: Payer: Self-pay

## 2023-09-02 ENCOUNTER — Ambulatory Visit: Payer: Self-pay | Admitting: *Deleted

## 2023-09-02 NOTE — Telephone Encounter (Signed)
Ok to d/c O2.  She will need to let us know the name of the company that provided the oxygen, so we can let them know to d/c so they will pick up the tanks.

## 2023-09-02 NOTE — Telephone Encounter (Signed)
  Chief Complaint: advice only regarding oxygen tanks for home use  Symptoms: recent order for CPAP at home  Frequency: na Pertinent Negatives: Patient denies na Disposition: [] ED /[] Urgent Care (no appt availability in office) / [] Appointment(In office/virtual)/ []  [facility]/ [] Home Care/ [] Refused Recommended Disposition /[] [facility]/ [x]  Follow-up with PCP Additional Notes:   Please advise if patient needs to continue to use oxygen at home. She has not being using. Recent order for home CPAP . Patient would like to know if oxygen not needed ,who can come pick up oxygen tanks that she is no longer using. Please advise.       Reason for Disposition  [1] Caller requesting NON-URGENT health information AND [2] PCP's office is the best resource  Answer Assessment - Initial Assessment Questions 1. REASON FOR CALL or QUESTION: "What is your reason for calling today?" or "How can I best help you?" or "What question do you have that I can help answer?"     Patient would like to know if she is required to continue using oxygen? Recent order of CPAP machine.  Protocols used: Information Only Call - No Triage-A-AH

## 2023-09-03 ENCOUNTER — Other Ambulatory Visit: Payer: Self-pay

## 2023-09-03 DIAGNOSIS — G4730 Sleep apnea, unspecified: Secondary | ICD-10-CM

## 2023-09-03 NOTE — Telephone Encounter (Signed)
LMTCB-Ok for [facility] to give patient provider's message and get information.

## 2023-09-04 NOTE — Telephone Encounter (Signed)
Community message was sent in regards to cpap order and it was confirmed order was received and being worked on. No mention that they could not work with [facility]

## 2023-09-04 NOTE — Telephone Encounter (Signed)
LMOM for patient to return call.

## 2023-09-06 ENCOUNTER — Telehealth: Payer: Self-pay | Admitting: Family Medicine

## 2023-09-06 NOTE — Telephone Encounter (Signed)
Rx to discontinue oxygen can be faxed to [phone]

## 2023-09-06 NOTE — Telephone Encounter (Addendum)
See TE 09/02/2023 from NT.  Pt returned missed call from [place], [place], [place] pt message below. Pt verbalized understanding. "Once the rx is received they will contact the patient to schedule a pickup of the equipment."  Please review.

## 2023-09-06 NOTE — Telephone Encounter (Signed)
LVM to advise patient of process. Received voicemail. Ok for [facility] to advise if patient returns call

## 2023-09-10 NOTE — Telephone Encounter (Signed)
Order to d/c faxed. Placed in sorter beside my desk until Friday

## 2023-09-10 NOTE — Telephone Encounter (Signed)
Ok to send rx to d/c O2. Happy to sign a paper rx if needed

## 2023-09-30 ENCOUNTER — Other Ambulatory Visit: Payer: Self-pay | Admitting: Family Medicine

## 2023-10-17 ENCOUNTER — Ambulatory Visit (INDEPENDENT_AMBULATORY_CARE_PROVIDER_SITE_OTHER): Payer: Medicare HMO | Admitting: Family Medicine

## 2023-10-17 ENCOUNTER — Encounter: Payer: Self-pay | Admitting: Family Medicine

## 2023-10-17 VITALS — BP 131/72 | HR 95 | Ht 65.0 in | Wt 182.9 lb

## 2023-10-17 DIAGNOSIS — R7303 Prediabetes: Secondary | ICD-10-CM | POA: Diagnosis not present

## 2023-10-17 DIAGNOSIS — G4733 Obstructive sleep apnea (adult) (pediatric): Secondary | ICD-10-CM | POA: Diagnosis not present

## 2023-10-17 DIAGNOSIS — I10 Essential (primary) hypertension: Secondary | ICD-10-CM

## 2023-10-17 NOTE — Assessment & Plan Note (Signed)
A1c in prediabetic range. Recommend checking A1c every six months. - Check A1c every six months

## 2023-10-17 NOTE — Progress Notes (Signed)
Established patient visit   Patient: Natalie Lucas   DOB: [birth year]   89 y.o. Female  MRN: [id number] Visit Date: 10/17/2023  Today's healthcare provider: Shirlee Latch, MD   Chief Complaint  Patient presents with   Medical Management of Chronic Issues    3 month follow-up.    Hypertension   Subjective    Hypertension   HPI     Medical Management of Chronic Issues    Additional comments: 3 month follow-up.         Hypertension   This is a chronic problem.  The problem is controlled.  Compliance with treatment is excellent.  Anxiety: Absent.  Blurred vision: Absent.  Chest pain: Absent.  Chest pressure/discomfort: Absent.  Dyspnea: Absent.  Headaches: Absent.  Lower extremity edema: Present.  Orthopnea: Absent.  Palpitations: Absent.  Paroxysmal nocturnal dyspnea: Absent.  Syncope: Absent.        Comments   Patient reports she is now using a Cpap Machine      Last edited by Acey Lav, CMA on 10/17/2023 10:50 AM.       Discussed the use of AI scribe software for clinical note transcription with the patient, who gave verbal consent to proceed.  History of Present Illness   The patient, with a history of sleep apnea, is currently using a CPAP machine. They report that they have adapted to the machine and it doesn't bother them. However, they also note that it hasn't made a significant change in their life. They are sleeping through the night and not having to get up to go to the bathroom. The patient is also on losartan and hydrochlorothiazide for blood pressure control, which they take once a day. They express concerns about cleaning the CPAP machine and have not been able to figure out how to remove the hose for cleaning. They rinse out the water box daily. The patient also mentions a scheduled follow-up regarding the CPAP machine.         Medications: Outpatient Medications Prior to Visit  Medication Sig   calcium-vitamin D (OSCAL WITH D) 500-5  MG-MCG tablet Take 1 tablet by mouth daily with breakfast.   ferrous sulfate 324 MG TBEC Take 324 mg by mouth 3 (three) times daily.   gabapentin (NEURONTIN) 100 MG capsule Take 100 mg by mouth 2 (two) times daily. (Patient taking differently: Take 100 mg by mouth 3 (three) times daily.)   hydrochlorothiazide (MICROZIDE) 12.5 MG capsule TAKE 1 CAPSULE BY MOUTH ONCE DAILY   losartan (COZAAR) 25 MG tablet Take 25 mg by mouth daily.   Multiple Vitamins-Minerals (WOMENS 50+ MULTI VITAMIN/MIN PO) Take by mouth.   apixaban (ELIQUIS) 5 MG TABS tablet Take 5 mg by mouth 2 (two) times daily.   [DISCONTINUED] metoprolol tartrate (LOPRESSOR) 50 MG tablet TAKE ONE TABLET BY MOUTH TWICE DAILY (Patient not taking: Reported on 10/17/2023)   No facility-administered medications prior to visit.    Review of Systems     Objective    BP 131/72 (BP Location: Left Arm, Patient Position: Sitting, Cuff Size: Normal)   Pulse 95   Ht 5\' 5"  (1.651 m)   Wt 182 lb 14.4 oz (83 kg)   SpO2 98%   BMI 30.44 kg/m    Physical Exam Vitals reviewed.  Constitutional:      General: She is not in acute distress.    Appearance: She is well-developed.  HENT:     Head: Normocephalic and atraumatic.  Eyes:  General: No scleral icterus.    Conjunctiva/sclera: Conjunctivae normal.  Cardiovascular:     Rate and Rhythm: Normal rate and regular rhythm.  Pulmonary:     Effort: Pulmonary effort is normal. No respiratory distress.  Skin:    General: Skin is warm and dry.     Findings: No rash.  Neurological:     Mental Status: She is alert and oriented to person, place, and time.  Psychiatric:        Behavior: Behavior normal.      No results found for any visits on 10/17/23.  Assessment & Plan     Problem List Items Addressed This Visit       Cardiovascular and Mediastinum   Essential hypertension   On losartan and hydrochlorothiazide daily. Discontinued metoprolol. Blood pressure well-controlled. Stopped  home monitoring due to stress. - Continue losartan and hydrochlorothiazide daily - Discontinue metoprolol - No need for home blood pressure monitoring if it causes stress        Respiratory   OSA (obstructive sleep apnea)   Using CPAP nightly for over four hours. Reports uninterrupted sleep. No significant issues with CPAP, but not adhering to cleaning regimen. Discussed importance of cleaning to avoid mildew inhalation. Recommended Soclean machine for easier cleaning. Explained insurance requires documentation of regular use and improvement for continued coverage. - Document CPAP usage and improvement for insurance purposes - Recommend Soclean machine for CPAP cleaning        Other   Prediabetes - Primary   A1c in prediabetic range. Recommend checking A1c every six months. - Check A1c every six months           General Health Maintenance Received latest COVID-19 vaccine and likely flu vaccine. No need for pneumonia vaccine. Discussed and recommended RSV vaccine (Arexvy). - Confirm flu vaccination status - Recommend RSV vaccine (Arexvy)  Follow-up - Schedule follow-up visit in three months - Include lab work at next visit.          Return in about 3 months (around 01/15/2024) for chronic disease f/u.       Shirlee Latch, MD  [facility] [phone] (phone) [phone] (fax)  [facility]

## 2023-10-17 NOTE — Assessment & Plan Note (Signed)
On losartan and hydrochlorothiazide daily. Discontinued metoprolol. Blood pressure well-controlled. Stopped home monitoring due to stress. - Continue losartan and hydrochlorothiazide daily - Discontinue metoprolol - No need for home blood pressure monitoring if it causes stress

## 2023-10-17 NOTE — Assessment & Plan Note (Signed)
Using CPAP nightly for over four hours. Reports uninterrupted sleep. No significant issues with CPAP, but not adhering to cleaning regimen. Discussed importance of cleaning to avoid mildew inhalation. Recommended Soclean machine for easier cleaning. Explained insurance requires documentation of regular use and improvement for continued coverage. - Document CPAP usage and improvement for insurance purposes - Recommend Soclean machine for CPAP cleaning

## 2023-11-07 ENCOUNTER — Ambulatory Visit: Payer: Medicare HMO | Admitting: Family Medicine

## 2023-11-18 ENCOUNTER — Encounter: Payer: Self-pay | Admitting: Family Medicine

## 2023-11-18 ENCOUNTER — Ambulatory Visit (INDEPENDENT_AMBULATORY_CARE_PROVIDER_SITE_OTHER): Payer: Medicare HMO | Admitting: Family Medicine

## 2023-11-18 VITALS — BP 110/72 | HR 92 | Temp 97.9°F | Resp 14 | Ht 64.0 in | Wt 185.0 lb

## 2023-11-18 DIAGNOSIS — E559 Vitamin D deficiency, unspecified: Secondary | ICD-10-CM | POA: Diagnosis not present

## 2023-11-18 DIAGNOSIS — R7303 Prediabetes: Secondary | ICD-10-CM

## 2023-11-18 DIAGNOSIS — I480 Paroxysmal atrial fibrillation: Secondary | ICD-10-CM | POA: Diagnosis not present

## 2023-11-18 DIAGNOSIS — W19XXXA Unspecified fall, initial encounter: Secondary | ICD-10-CM

## 2023-11-18 DIAGNOSIS — I10 Essential (primary) hypertension: Secondary | ICD-10-CM

## 2023-11-18 DIAGNOSIS — S20212A Contusion of left front wall of thorax, initial encounter: Secondary | ICD-10-CM

## 2023-11-18 DIAGNOSIS — E785 Hyperlipidemia, unspecified: Secondary | ICD-10-CM

## 2023-11-18 MED ORDER — APIXABAN 5 MG PO TABS: 5.0000 mg | ORAL_TABLET | Freq: Two times a day (BID) | ORAL | 1 refills | Status: AC

## 2023-11-18 NOTE — Assessment & Plan Note (Signed)
Reviewed last lipid panel

## 2023-11-18 NOTE — Assessment & Plan Note (Signed)
On Eliquis 5 mg twice daily. No issues with bleeding or bruising, even after the recent fall. No rate or rhythm control, in NSR today. - Send prescription for Eliquis 5 mg twice daily

## 2023-11-18 NOTE — Progress Notes (Signed)
Established patient visit   Patient: Natalie Lucas   DOB: 1927-08-11   89 y.o. Female  MRN: [id number] Visit Date: 11/18/2023  Today's healthcare provider: Shirlee Latch, MD   Chief Complaint  Patient presents with   Follow-up    37mo f/u  Had a fall 2 wks ago on scooter thinks she may possibly have cracked some ribs on left side but the soreness is starting to get better everyday    Subjective    HPI HPI     Follow-up    Additional comments: 37mo f/u  Had a fall 2 wks ago on scooter thinks she may possibly have cracked some ribs on left side but the soreness is starting to get better everyday       Last edited by Clois Comber on 11/18/2023  2:44 PM.       Discussed the use of AI scribe software for clinical note transcription with the patient, who gave verbal consent to proceed.  History of Present Illness   The patient, with a history of hypertension, atrial fibrillation, and prediabetes, presents for a routine six-month follow-up. The patient had a fall a couple of weeks ago, hitting their left side against a wall. The patient describes the pain as improving daily, but still experiences discomfort, especially when coughing or sneezing. The patient has been managing the pain with extra strength Tylenol. The patient denies any breathing difficulties. The patient's blood pressure is well-controlled on hydrochlorothiazide 12.5mg  and losartan 25mg  daily. The patient's atrial fibrillation is managed with Eliquis 5mg  twice daily, with no reported bleeding issues. The patient's prediabetes is being monitored, with the last A1c showing a decrease.         Medications: Outpatient Medications Prior to Visit  Medication Sig   calcium-vitamin D (OSCAL WITH D) 500-5 MG-MCG tablet Take 1 tablet by mouth daily with breakfast.   ferrous sulfate 324 MG TBEC Take 324 mg by mouth 3 (three) times daily.   gabapentin (NEURONTIN) 100 MG capsule Take 100 mg by mouth 2 (two) times  daily.   hydrochlorothiazide (MICROZIDE) 12.5 MG capsule TAKE 1 CAPSULE BY MOUTH ONCE DAILY   losartan (COZAAR) 25 MG tablet Take 25 mg by mouth daily.   Multiple Vitamins-Minerals (WOMENS 50+ MULTI VITAMIN/MIN PO) Take by mouth.   [DISCONTINUED] apixaban (ELIQUIS) 5 MG TABS tablet Take 5 mg by mouth 2 (two) times daily.   No facility-administered medications prior to visit.    Review of Systems     Objective    BP 110/72 (BP Location: Left Arm, Patient Position: Sitting, Cuff Size: Normal)   Pulse 92   Temp 97.9 F (36.6 C)   Resp 14   Ht 5\' 4"  (1.626 m)   Wt 185 lb (83.9 kg)   SpO2 95%   BMI 31.76 kg/m    Physical Exam Vitals reviewed.  Constitutional:      General: She is not in acute distress.    Appearance: Normal appearance. She is well-developed. She is not diaphoretic.     Comments: Walks with walker  HENT:     Head: Normocephalic and atraumatic.  Eyes:     General: No scleral icterus.    Conjunctiva/sclera: Conjunctivae normal.  Neck:     Thyroid: No thyromegaly.  Cardiovascular:     Rate and Rhythm: Normal rate and regular rhythm.     Heart sounds: Normal heart sounds.  Pulmonary:     Effort: Pulmonary effort is normal. No respiratory  distress.     Breath sounds: Normal breath sounds. No wheezing, rhonchi or rales.  Musculoskeletal:     Cervical back: Neck supple.     Right lower leg: No edema.     Left lower leg: No edema.  Lymphadenopathy:     Cervical: No cervical adenopathy.  Skin:    General: Skin is warm and dry.     Findings: No rash.  Neurological:     Mental Status: She is alert and oriented to person, place, and time. Mental status is at baseline.  Psychiatric:        Mood and Affect: Mood normal.        Behavior: Behavior normal.    Mild TTP over L lateral lower ribs, no bri=using or deformity  No results found for any visits on 11/18/23.  Assessment & Plan     Problem List Items Addressed This Visit       Cardiovascular and  Mediastinum   Essential hypertension - Primary   Blood pressure is well-controlled with hydrochlorothiazide 12.5 mg and losartan 25 mg daily. - Continue current medications      Relevant Medications   apixaban (ELIQUIS) 5 MG TABS tablet   Paroxysmal atrial fibrillation (HCC)   On Eliquis 5 mg twice daily. No issues with bleeding or bruising, even after the recent fall. No rate or rhythm control, in NSR today. - Send prescription for Eliquis 5 mg twice daily      Relevant Medications   apixaban (ELIQUIS) 5 MG TABS tablet     Other   Hyperlipemia   Reviewed last lipid panel      Relevant Medications   apixaban (ELIQUIS) 5 MG TABS tablet   Avitaminosis D   Prediabetes   A1c was down at the last check four months ago. Emphasized the importance of maintaining dietary awareness to stay prediabetic. - Monitor A1c at the next visit      Other Visit Diagnoses       Fall, initial encounter         Contusion of rib on left side, initial encounter               Left Rib Contusion Experienced a fall two weeks ago, resulting in left-sided rib pain. Pain is improving daily without significant impact on breathing. Managed with extra strength Tylenol. Discussed that an x-ray could confirm a rib fracture but would not change management as rib fractures typically heal on their own. Patient agreed with this approach. - Continue rest and avoid further falls  General Health Maintenance Routine labs reviewed four months ago were within normal limits. Patient is in a continuing care facility with good social support from family. - Schedule routine labs at the next visit - Set up next visit for physical in six months          Return in about 6 months (around 05/17/2024) for CPE.       Shirlee Latch, MD  [facility] [phone] (phone) [phone] (fax)  [facility]

## 2023-11-18 NOTE — Assessment & Plan Note (Signed)
Blood pressure is well-controlled with hydrochlorothiazide 12.5 mg and losartan 25 mg daily. - Continue current medications

## 2023-11-18 NOTE — Assessment & Plan Note (Signed)
A1c was down at the last check four months ago. Emphasized the importance of maintaining dietary awareness to stay prediabetic. - Monitor A1c at the next visit

## 2023-12-30 ENCOUNTER — Other Ambulatory Visit: Payer: Self-pay | Admitting: Family Medicine

## 2023-12-31 NOTE — Telephone Encounter (Signed)
 Requested Prescriptions  Pending Prescriptions Disp Refills   hydrochlorothiazide (MICROZIDE) 12.5 MG capsule [Pharmacy Med Name: HYDROCHLOROTHIAZIDE 12.5 MG CAP] 90 capsule 0    Sig: TAKE 1 CAPSULE BY MOUTH ONCE DAILY     Cardiovascular: Diuretics - Thiazide Passed - 12/31/2023  5:41 PM      Passed - Cr in normal range and within 180 days    Creatinine, Ser  Date Value Ref Range Status  07/18/2023 0.88 0.57 - 1.00 mg/dL Final         Passed - K in normal range and within 180 days    Potassium  Date Value Ref Range Status  07/18/2023 4.2 3.5 - 5.2 mmol/L Final         Passed - Na in normal range and within 180 days    Sodium  Date Value Ref Range Status  07/18/2023 140 134 - 144 mmol/L Final         Passed - Last BP in normal range    BP Readings from Last 1 Encounters:  11/18/23 110/72         Passed - Valid encounter within last 6 months    Recent Outpatient Visits           1 month ago Essential hypertension   [place] [place], Marzella Schlein, MD   2 months ago Prediabetes   [facility] [place], Marzella Schlein, MD   5 months ago Essential hypertension   [place] [facility] [place], Marzella Schlein, MD   6 months ago Essential hypertension   [place] [facility] [place], Marzella Schlein, MD   8 months ago Encounter for annual physical exam   [facility] [place], Marzella Schlein, MD       Future Appointments             In 3 weeks [place], Marzella Schlein, MD [facility]   In 4 months Bacigalupo, Marzella Schlein, MD [facility] [place], [facility]

## 2024-01-23 ENCOUNTER — Ambulatory Visit: Payer: Self-pay | Admitting: Family Medicine

## 2024-02-06 ENCOUNTER — Encounter: Payer: Self-pay | Admitting: Family Medicine

## 2024-02-06 ENCOUNTER — Ambulatory Visit (INDEPENDENT_AMBULATORY_CARE_PROVIDER_SITE_OTHER): Admitting: Family Medicine

## 2024-02-06 VITALS — BP 122/65 | HR 85 | Resp 18 | Ht 64.0 in | Wt 185.5 lb

## 2024-02-06 DIAGNOSIS — G4733 Obstructive sleep apnea (adult) (pediatric): Secondary | ICD-10-CM

## 2024-02-06 DIAGNOSIS — I480 Paroxysmal atrial fibrillation: Secondary | ICD-10-CM

## 2024-02-06 DIAGNOSIS — I10 Essential (primary) hypertension: Secondary | ICD-10-CM | POA: Diagnosis not present

## 2024-02-06 DIAGNOSIS — D649 Anemia, unspecified: Secondary | ICD-10-CM

## 2024-02-06 DIAGNOSIS — R7303 Prediabetes: Secondary | ICD-10-CM

## 2024-02-06 DIAGNOSIS — E559 Vitamin D deficiency, unspecified: Secondary | ICD-10-CM | POA: Diagnosis not present

## 2024-02-06 DIAGNOSIS — E785 Hyperlipidemia, unspecified: Secondary | ICD-10-CM

## 2024-02-06 DIAGNOSIS — Z7901 Long term (current) use of anticoagulants: Secondary | ICD-10-CM

## 2024-02-06 NOTE — Assessment & Plan Note (Signed)
 Previously anemic post-hospitalization but returned to normal levels. Not taking iron due to constipation. Plan to check blood counts to assess need for iron supplementation. If anemia is present, consider an alternative iron supplement with less constipation risk. - Order complete blood count (CBC) - Consider alternative iron supplement if anemia is present

## 2024-02-06 NOTE — Assessment & Plan Note (Signed)
 Blood pressure is well-controlled on hydrochlorothiazide and losartan. - Continue hydrochlorothiazide 12.5 mg daily - Continue losartan 25 mg daily

## 2024-02-06 NOTE — Progress Notes (Signed)
 Established patient visit   Patient: Natalie Lucas   DOB: 1927/05/05   89 y.o. Female  MRN: [id number] Visit Date: 02/06/2024  Today's healthcare provider: Shirlee Latch, MD   Chief Complaint  Patient presents with   Follow-up    Visit for Cpap machine?    Hypertension   Shortness of Breath    Sunday, Monday, Tuesday, pt had episode of heavy breathing and SOB that came on randomly so she satdown and checked pulse with reader and it jumped all over the place,   Subjective    Hypertension Associated symptoms include shortness of breath.  Shortness of Breath   HPI     Follow-up    Additional comments: Visit for Cpap machine?         Shortness of Breath    Additional comments: Sunday, Monday, Tuesday, pt had episode of heavy breathing and SOB that came on randomly so she satdown and checked pulse with reader and it jumped all over the place,      Last edited by Allayne Stack on 02/06/2024  3:19 PM.       Discussed the use of AI scribe software for clinical note transcription with the patient, who gave verbal consent to proceed.  History of Present Illness   The patient, a 89 year old with a history of atrial fibrillation (AFib) and sleep apnea, presents with recent episodes of shortness of breath and an uneasy feeling. She suspects these symptoms are due to a rapid heartbeat, which she has been able to confirm with a pulse oximeter. These episodes appear to occur spontaneously, without any unusual activity, and have been occurring since the previous Saturday. The patient has never experienced these symptoms before, despite her known history of AFib.  In addition to her cardiac concerns, the patient also discusses her sleep apnea treatment. She has been using a CPAP machine and reports that she believes it has improved her energy levels and reduced her nocturnal awakenings.  The patient also mentions that she has stopped taking iron due to constipation. She was  previously prescribed iron following a hospitalization last year where she was found to be anemic. However, she has not been taking it due to the side effect of constipation.         Medications: Outpatient Medications Prior to Visit  Medication Sig   apixaban (ELIQUIS) 5 MG TABS tablet Take 1 tablet (5 mg total) by mouth 2 (two) times daily.   calcium-vitamin D (OSCAL WITH D) 500-5 MG-MCG tablet Take 1 tablet by mouth daily with breakfast.   ferrous sulfate 324 MG TBEC Take 324 mg by mouth 3 (three) times daily.   gabapentin (NEURONTIN) 100 MG capsule Take 100 mg by mouth 2 (two) times daily.   hydrochlorothiazide (MICROZIDE) 12.5 MG capsule TAKE 1 CAPSULE BY MOUTH ONCE DAILY   losartan (COZAAR) 25 MG tablet Take 25 mg by mouth daily.   Multiple Vitamins-Minerals (WOMENS 50+ MULTI VITAMIN/MIN PO) Take by mouth.   No facility-administered medications prior to visit.    Review of Systems  Respiratory:  Positive for shortness of breath.        Objective    BP 122/65 (BP Location: Left Arm, Cuff Size: Normal)   Pulse 85   Resp 18   Ht 5\' 4"  (1.626 m)   Wt 185 lb 8 oz (84.1 kg)   SpO2 97%   BMI 31.84 kg/m    Physical Exam Vitals reviewed.  Constitutional:  General: She is not in acute distress.    Appearance: Normal appearance. She is well-developed. She is not diaphoretic.  HENT:     Head: Normocephalic and atraumatic.  Eyes:     General: No scleral icterus.    Conjunctiva/sclera: Conjunctivae normal.  Neck:     Thyroid: No thyromegaly.  Cardiovascular:     Rate and Rhythm: Normal rate and regular rhythm.     Heart sounds: Normal heart sounds. No murmur heard. Pulmonary:     Effort: Pulmonary effort is normal. No respiratory distress.     Breath sounds: Normal breath sounds. No wheezing, rhonchi or rales.  Musculoskeletal:     Cervical back: Neck supple.     Right lower leg: Edema present.     Left lower leg: Edema present.  Lymphadenopathy:     Cervical:  No cervical adenopathy.  Skin:    General: Skin is warm and dry.     Findings: No rash.  Neurological:     Mental Status: She is alert and oriented to person, place, and time. Mental status is at baseline.  Psychiatric:        Mood and Affect: Mood normal.        Behavior: Behavior normal.      No results found for any visits on 02/06/24.  Assessment & Plan     Problem List Items Addressed This Visit       Cardiovascular and Mediastinum   Essential hypertension - Primary   Blood pressure is well-controlled on hydrochlorothiazide and losartan. - Continue hydrochlorothiazide 12.5 mg daily - Continue losartan 25 mg daily      Relevant Orders   Comprehensive metabolic panel with GFR   Paroxysmal atrial fibrillation (HCC)   Episodes of shortness of breath and palpitations consistent with AFib began on Saturday and resolved by the visit. She has a known AFib diagnosis from heart monitoring and is not on a beta blocker due to prior absence of symptoms. Discussed initiating metoprolol if episodes persist, but she prefers to wait. Metoprolol may lower pulse rate but can cause fatigue if dosed too high. Advised to journal symptoms if she recurs. - Monitor for recurrence of AFib symptoms - Consider starting metoprolol if symptoms persist - Instruct her to keep a journal of symptoms if she recurs        Respiratory   OSA (obstructive sleep apnea)     Other   Hyperlipemia   Reviewed last lipid panel Repeat today      Relevant Orders   Comprehensive metabolic panel with GFR   Lipid panel   Avitaminosis D   Relevant Orders   VITAMIN D 25 Hydroxy (Vit-D Deficiency, Fractures)   Prediabetes   A1c was down at the last check four months ago. Emphasized the importance of maintaining dietary awareness - Repeat A1c      Relevant Orders   Hemoglobin A1c   Anemia   Previously anemic post-hospitalization but returned to normal levels. Not taking iron due to constipation. Plan to  check blood counts to assess need for iron supplementation. If anemia is present, consider an alternative iron supplement with less constipation risk. - Order complete blood count (CBC) - Consider alternative iron supplement if anemia is present      Other Visit Diagnoses       Anticoagulated       Relevant Orders   CBC w/Diff/Platelet           General Health Maintenance Routine labs due: cholesterol, vitamin  D, kidney and liver function, A1c, and blood counts. Using CPAP for sleep apnea with improved energy and sleep quality. Not following all CPAP cleaning procedures but managing adequately. - Order labs: cholesterol, vitamin D, kidney and liver function, A1c, and CBC - Continue CPAP use as tolerated  Follow-up Physical exam scheduled for July. Considering six-month follow-up schedule instead of every three months due to good CPAP management. - Follow up with physical exam in July - Consider transitioning to six-month follow-up schedule after July       Return in about 3 months (around 05/07/2024) for chronic disease f/u, as scheduled.       Shirlee Latch, MD  [facility] [phone] (phone) [phone] (fax)  [facility]

## 2024-02-06 NOTE — Assessment & Plan Note (Signed)
 A1c was down at the last check four months ago. Emphasized the importance of maintaining dietary awareness - Repeat A1c

## 2024-02-06 NOTE — Assessment & Plan Note (Signed)
 Episodes of shortness of breath and palpitations consistent with AFib began on Saturday and resolved by the visit. She has a known AFib diagnosis from heart monitoring and is not on a beta blocker due to prior absence of symptoms. Discussed initiating metoprolol if episodes persist, but she prefers to wait. Metoprolol may lower pulse rate but can cause fatigue if dosed too high. Advised to journal symptoms if she recurs. - Monitor for recurrence of AFib symptoms - Consider starting metoprolol if symptoms persist - Instruct her to keep a journal of symptoms if she recurs

## 2024-02-06 NOTE — Assessment & Plan Note (Signed)
 Reviewed last lipid panel Repeat today

## 2024-02-07 LAB — CBC WITH DIFFERENTIAL/PLATELET
Basophils Absolute: 0 10*3/uL (ref 0.0–0.2)
Basos: 1 %
EOS (ABSOLUTE): 0.1 10*3/uL (ref 0.0–0.4)
Eos: 1 %
Hematocrit: 38 % (ref 34.0–46.6)
Hemoglobin: 12.5 g/dL (ref 11.1–15.9)
Immature Grans (Abs): 0 10*3/uL (ref 0.0–0.1)
Immature Granulocytes: 0 %
Lymphocytes Absolute: 1.5 10*3/uL (ref 0.7–3.1)
Lymphs: 18 %
MCH: 30.9 pg (ref 26.6–33.0)
MCHC: 32.9 g/dL (ref 31.5–35.7)
MCV: 94 fL (ref 79–97)
Monocytes Absolute: 0.7 10*3/uL (ref 0.1–0.9)
Monocytes: 8 %
Neutrophils Absolute: 6.2 10*3/uL (ref 1.4–7.0)
Neutrophils: 72 %
Platelets: 264 10*3/uL (ref 150–450)
RBC: 4.05 x10E6/uL (ref 3.77–5.28)
RDW: 11.9 % (ref 11.7–15.4)
WBC: 8.5 10*3/uL (ref 3.4–10.8)

## 2024-02-07 LAB — LIPID PANEL
Chol/HDL Ratio: 3.9 ratio (ref 0.0–4.4)
Cholesterol, Total: 228 mg/dL — ABNORMAL HIGH (ref 100–199)
HDL: 59 mg/dL (ref >39)
LDL Chol Calc (NIH): 134 mg/dL — ABNORMAL HIGH (ref 0–99)
Triglycerides: 195 mg/dL — ABNORMAL HIGH (ref 0–149)
VLDL Cholesterol Cal: 35 mg/dL (ref 5–40)

## 2024-02-07 LAB — COMPREHENSIVE METABOLIC PANEL WITH GFR
ALT: 21 IU/L (ref 0–32)
AST: 14 IU/L (ref 0–40)
Albumin: 3.5 g/dL — ABNORMAL LOW (ref 3.6–4.6)
Alkaline Phosphatase: 105 IU/L (ref 44–121)
BUN/Creatinine Ratio: 31 — ABNORMAL HIGH (ref 12–28)
BUN: 28 mg/dL (ref 10–36)
Bilirubin Total: 0.2 mg/dL (ref 0.0–1.2)
CO2: 24 mmol/L (ref 20–29)
Calcium: 9 mg/dL (ref 8.7–10.3)
Chloride: 104 mmol/L (ref 96–106)
Creatinine, Ser: 0.89 mg/dL (ref 0.57–1.00)
Globulin, Total: 2.5 g/dL (ref 1.5–4.5)
Glucose: 150 mg/dL — ABNORMAL HIGH (ref 70–99)
Potassium: 4.4 mmol/L (ref 3.5–5.2)
Sodium: 141 mmol/L (ref 134–144)
Total Protein: 6 g/dL (ref 6.0–8.5)
eGFR: 60 mL/min/{1.73_m2} (ref >59)

## 2024-02-07 LAB — HEMOGLOBIN A1C
Est. average glucose Bld gHb Est-mCnc: 146 mg/dL
Hgb A1c MFr Bld: 6.7 % — ABNORMAL HIGH (ref 4.8–5.6)

## 2024-02-07 LAB — VITAMIN D 25 HYDROXY (VIT D DEFICIENCY, FRACTURES): Vit D, 25-Hydroxy: 32.4 ng/mL (ref 30.0–100.0)

## 2024-03-10 ENCOUNTER — Ambulatory Visit (INDEPENDENT_AMBULATORY_CARE_PROVIDER_SITE_OTHER): Payer: Self-pay

## 2024-03-10 DIAGNOSIS — Z0000 Encounter for general adult medical examination without abnormal findings: Secondary | ICD-10-CM

## 2024-03-10 NOTE — Patient Instructions (Signed)
 Ms. Natalie Lucas , Thank you for taking time out of your busy schedule to complete your Annual Wellness Visit with me. I enjoyed our conversation and look forward to speaking with you again next year. I, as well as your care team,  appreciate your ongoing commitment to your health goals. Please review the following plan we discussed and let me know if I can assist you in the future.  Follow up Visits: Next Medicare AWV with our clinical staff:   03/17/25 @ 1:50 PM BY PHONE Have you seen your provider in the last 6 months (3 months if uncontrolled diabetes)? Yes  Clinician Recommendations:  Aim for 30 minutes of exercise or brisk walking, 6-8 glasses of water, and 5 servings of fruits and vegetables each day. KEEP TAKING CARE OF YOURSELF!      This is a list of the screening recommended for you and due dates:  Health Maintenance  Topic Date Due   DTaP/Tdap/Td vaccine (1 - Tdap) Never done   Zoster (Shingles) Vaccine (2 of 2) 07/06/2022   COVID-19 Vaccine (6 - 2024-25 season) 06/30/2023   Flu Shot  05/29/2024   Medicare Annual Wellness Visit  03/10/2025   Pneumonia Vaccine  Completed   DEXA scan (bone density measurement)  Completed   HPV Vaccine  Aged Out   Meningitis B Vaccine  Aged Out    Advanced directives: (ACP Link)Information on Advanced Care Planning can be found at [place]  Print production planner Health Care Directives Advance Health Care Directives. [email or web address]  Advance Care Planning is important because it:  [x]  Makes sure you receive the medical care that is consistent with your values, goals, and preferences  [x]  It provides guidance to your family and loved ones and reduces their decisional burden about whether or not they are making the right decisions based on your wishes.  Follow the link provided in your after visit summary or read over the paperwork we have mailed to you to help you started getting your Advance Directives in place. If you need assistance in completing  these, please reach out to us  so that we can help you!

## 2024-03-10 NOTE — Progress Notes (Signed)
 Subjective:   Natalie Lucas is a 89 y.o. who presents for a Medicare Wellness preventive visit.  As a reminder, Annual Wellness Visits don't include a physical exam, and some assessments may be limited, especially if this visit is performed virtually. We may recommend an in-person visit if needed.  Visit Complete: Virtual I connected with  Natalie Lucas on 03/10/24 by a audio enabled telemedicine application and verified that I am speaking with the correct person using two identifiers.  Patient Location: Home  Provider Location: Office/Clinic  I discussed the limitations of evaluation and management by telemedicine. The patient expressed understanding and agreed to proceed.  Vital Signs: Because this visit was a virtual/telehealth visit, some criteria may be missing or patient reported. Any vitals not documented were not able to be obtained and vitals that have been documented are patient reported.  VideoDeclined- This patient declined Librarian, academic. Therefore the visit was completed with audio only.  Persons Participating in Visit: Patient.  AWV Questionnaire: No: Patient Medicare AWV questionnaire was not completed prior to this visit.  Cardiac Risk Factors include: advanced age (>62men, >77 women);dyslipidemia;hypertension;sedentary lifestyle;obesity (BMI >30kg/m2)     Objective:     There were no vitals filed for this visit. There is no height or weight on file to calculate BMI.     03/10/2024    1:22 PM 03/05/2023    3:49 PM 03/11/2018    2:11 PM  Advanced Directives  Does Patient Have a Medical Advance Directive? No Yes Yes  Type of Special educational needs teacher of [place];Living will Healthcare Power of [place];Living will  Copy of Healthcare Power of Attorney in Chart?   No - copy requested  Would patient like information on creating a medical advance directive? No - Patient declined      Current Medications  (verified) Outpatient Encounter Medications as of 03/10/2024  Medication Sig   metoprolol  tartrate (LOPRESSOR ) 25 MG tablet Take 25 mg by mouth daily as needed.   apixaban  (ELIQUIS ) 5 MG TABS tablet Take 1 tablet (5 mg total) by mouth 2 (two) times daily.   calcium -vitamin D  (OSCAL WITH D) 500-5 MG-MCG tablet Take 1 tablet by mouth daily with breakfast.   ferrous sulfate  324 MG TBEC Take 324 mg by mouth 3 (three) times daily. (Patient not taking: Reported on 03/10/2024)   gabapentin (NEURONTIN) 100 MG capsule Take 100 mg by mouth 2 (two) times daily.   hydrochlorothiazide  (MICROZIDE ) 12.5 MG capsule TAKE 1 CAPSULE BY MOUTH ONCE DAILY   losartan (COZAAR) 25 MG tablet Take 25 mg by mouth daily.   Multiple Vitamins-Minerals (WOMENS 50+ MULTI VITAMIN/MIN PO) Take by mouth.   No facility-administered encounter medications on file as of 03/10/2024.    Allergies (verified) Ace inhibitors, Aspirin, Other, and Salicylates   History: Past Medical History:  Diagnosis Date   Hyperlipidemia    Hypertension    Past Surgical History:  Procedure Laterality Date   BREAST SURGERY Right 08/2002   Ductal Hyperplasia   COSMETIC SURGERY  1993   JOINT REPLACEMENT Left    Left knee replacement.   KNEE SURGERY Left 2002   Family History  Problem Relation Age of Onset   Dementia Mother    Transient ischemic attack Mother    Lung cancer Father    Cancer Paternal Uncle    Cancer Paternal Grandfather    Melanoma Cousin    Social History   Socioeconomic History   Marital status: Widowed  Spouse name: Not on file   Number of children: 2   Years of education: College   Highest education level: Bachelor's degree (e.g., BA, AB, BS)  Occupational History   Occupation: Retired  Tobacco Use   Smoking status: Former   Smokeless tobacco: Never   Tobacco comments:    social smoker - quit 60+ years ago  Vaping Use   Vaping status: Never Used  Substance and Sexual Activity   Alcohol use: No   Drug  use: No   Sexual activity: Not on file  Other Topics Concern   Not on file  Social History Narrative   Not on file   Social Drivers of Health   Financial Resource Strain: Low Risk  (03/10/2024)   Overall Financial Resource Strain (CARDIA)    Difficulty of Paying Living Expenses: Not hard at all  Food Insecurity: No Food Insecurity (03/10/2024)   Hunger Vital Sign    Worried About Running Out of Food in the Last Year: Never true    Ran Out of Food in the Last Year: Never true  Transportation Needs: No Transportation Needs (03/10/2024)   PRAPARE - Administrator, Civil Service (Medical): No    Lack of Transportation (Non-Medical): No  Physical Activity: Inactive (03/10/2024)   Exercise Vital Sign    Days of Exercise per Week: 0 days    Minutes of Exercise per Session: 0 min  Stress: No Stress Concern Present (03/10/2024)   [employer] of Occupational Health - Occupational Stress Questionnaire    Feeling of Stress : Not at all  Social Connections: Moderately Isolated (03/10/2024)   Social Connection and Isolation Panel [NHANES]    Frequency of Communication with Friends and Family: More than three times a week    Frequency of Social Gatherings with Friends and Family: Twice a week    Attends Religious Services: More than 4 times per year    Active Member of [employer] or Organizations: No    Attends Banker Meetings: Never    Marital Status: Widowed    Tobacco Counseling Counseling given: Not Answered Tobacco comments: social smoker - quit 60+ years ago    Clinical Intake:  Pre-visit preparation completed: Yes  Pain : No/denies pain     BMI - recorded: 31.8 Nutritional Status: BMI > 30  Obese Nutritional Risks: None Diabetes: No  Lab Results  Component Value Date   HGBA1C 6.7 (H) 02/06/2024   HGBA1C 6.1 (H) 07/18/2023   HGBA1C 6.8 (H) 04/23/2023     How often do you need to have someone help you when you read instructions, pamphlets, or  other written materials from your doctor or pharmacy?: 1 - Never  Interpreter Needed?: No  Information entered by :: Dellie Fergusson, LPN   Activities of Daily Living    03/10/2024    1:23 PM 06/11/2023   10:05 AM  In your present state of health, do you have any difficulty performing the following activities:  Hearing? 0 1  Vision? 0 0  Difficulty concentrating or making decisions? 0 0  Walking or climbing stairs? 1 1  Dressing or bathing? 0 0  Doing errands, shopping? 0 1  Preparing Food and eating ? N   Using the Toilet? N   In the past six months, have you accidently leaked urine? Y   Do you have problems with loss of bowel control? N   Managing your Medications? N   Managing your Finances? N   Housekeeping or  managing your Housekeeping? N     Patient Care Team: Mazie Speed, MD as PCP - General (Family Medicine) Percival Brace, MD as Consulting Physician (Cardiology) Pa, [facility])  Indicate any recent Medical Services you may have received from other than [facility] providers in the past year (date may be approximate).     Assessment:    This is a routine wellness examination for [facility].  Hearing/Vision screen Hearing Screening - Comments:: NO AIDS Vision Screening - Comments:: WEARS GLASSES ALL DAY- [facility]   Goals Addressed             This Visit's Progress    DIET - EAT MORE FRUITS AND VEGETABLES         Depression Screen     03/10/2024    1:20 PM 02/06/2024    3:25 PM 11/18/2023    2:48 PM 10/17/2023   10:50 AM 07/18/2023   10:45 AM 06/11/2023   10:05 AM 04/23/2023    2:17 PM  PHQ 2/9 Scores  PHQ - 2 Score 0 0 0 0 0 0 0  PHQ- 9 Score 0  0  1 0 0    Fall Risk     03/10/2024    1:23 PM 02/06/2024    3:25 PM 10/17/2023   10:50 AM 07/18/2023   10:45 AM 06/11/2023   10:05 AM  Fall Risk   Falls in the past year? 0 0 0 0 0  Number falls in past yr: 0 0 0 0 0  Injury with Fall? 0 0 0 0 0  Risk for fall due to : No Fall  Risks  No Fall Risks  No Fall Risks  Follow up Falls prevention discussed;Falls evaluation completed  Falls evaluation completed  Falls evaluation completed    MEDICARE RISK AT HOME:     TIMED UP AND GO:  Was the test performed?  No  Cognitive Function: 6CIT completed        03/05/2023    3:56 PM 02/08/2022    3:20 PM 07/30/2019   10:13 AM  6CIT Screen  What Year? 0 points 0 points 0 points  What month? 0 points 0 points 0 points  What time? 0 points 0 points 0 points  Count back from 20 0 points 0 points 0 points  Months in reverse 0 points 0 points 0 points  Repeat phrase 0 points 2 points 0 points  Total Score 0 points 2 points 0 points    Immunizations Immunization History  Administered Date(s) Administered   Fluad Quad(high Dose 65+) 07/30/2019, 08/02/2020, 07/29/2022   Influenza Split 09/16/2009, 09/05/2011   Influenza, High Dose Seasonal PF 07/12/2015, 07/18/2016, 07/26/2017, 08/09/2018   PFIZER Comirnaty(Gray Top)Covid-19 Tri-Sucrose Vaccine 11/04/2019, 12/02/2019, 08/04/2020, 03/15/2021   Pfizer(Comirnaty)Fall Seasonal Vaccine 12 years and older 08/09/2021   Pneumococcal Conjugate-13 07/12/2015   Pneumococcal Polysaccharide-23 08/30/1998   Zoster Recombinant(Shingrix) 05/11/2022    Screening Tests Health Maintenance  Topic Date Due   DTaP/Tdap/Td (1 - Tdap) Never done   Zoster Vaccines- Shingrix (2 of 2) 07/06/2022   COVID-19 Vaccine (6 - 2024-25 season) 06/30/2023   INFLUENZA VACCINE  05/29/2024   Medicare Annual Wellness (AWV)  03/10/2025   Pneumonia Vaccine 65+ Years old  Completed   DEXA SCAN  Completed   HPV VACCINES  Aged Out   Meningococcal B Vaccine  Aged Out    Health Maintenance  Health Maintenance Due  Topic Date Due   DTaP/Tdap/Td (1 - Tdap) Never done  Zoster Vaccines- Shingrix (2 of 2) 07/06/2022   COVID-19 Vaccine (6 - 2024-25 season) 06/30/2023   Health Maintenance Items Addressed: UP TO DATE EXCEPT FOR PNA  Additional  Screening:  Vision Screening: Recommended annual ophthalmology exams for early detection of glaucoma and other disorders of the eye.  Dental Screening: Recommended annual dental exams for proper oral hygiene  Community Resource Referral / Chronic Care Management: CRR required this visit?  No   CCM required this visit?  No   Plan:    I have personally reviewed and noted the following in the patient's chart:   Medical and social history Use of alcohol, tobacco or illicit drugs  Current medications and supplements including opioid prescriptions. Patient is not currently taking opioid prescriptions. Functional ability and status Nutritional status Physical activity Advanced directives List of other physicians Hospitalizations, surgeries, and ER visits in previous 12 months Vitals Screenings to include cognitive, depression, and falls Referrals and appointments  In addition, I have reviewed and discussed with patient certain preventive protocols, quality metrics, and best practice recommendations. A written personalized care plan for preventive services as well as general preventive health recommendations were provided to patient.   Pinky Bright, LPN   1/47/8295   After Visit Summary: (MyChart) Due to this being a telephonic visit, the after visit summary with patients personalized plan was offered to patient via MyChart   Notes: Nothing significant to report at this time.

## 2024-03-29 ENCOUNTER — Other Ambulatory Visit: Payer: Self-pay | Admitting: Family Medicine

## 2024-05-19 ENCOUNTER — Encounter: Payer: Self-pay | Admitting: Family Medicine

## 2024-05-19 ENCOUNTER — Ambulatory Visit (INDEPENDENT_AMBULATORY_CARE_PROVIDER_SITE_OTHER): Payer: Self-pay | Admitting: Family Medicine

## 2024-05-19 VITALS — BP 116/73 | HR 76 | Ht 64.0 in | Wt 185.7 lb

## 2024-05-19 DIAGNOSIS — I878 Other specified disorders of veins: Secondary | ICD-10-CM

## 2024-05-19 DIAGNOSIS — Z0000 Encounter for general adult medical examination without abnormal findings: Secondary | ICD-10-CM | POA: Diagnosis not present

## 2024-05-19 DIAGNOSIS — I4892 Unspecified atrial flutter: Secondary | ICD-10-CM | POA: Diagnosis not present

## 2024-05-19 DIAGNOSIS — G4733 Obstructive sleep apnea (adult) (pediatric): Secondary | ICD-10-CM

## 2024-05-19 DIAGNOSIS — I10 Essential (primary) hypertension: Secondary | ICD-10-CM

## 2024-05-19 NOTE — Progress Notes (Unsigned)
 Complete physical exam   Patient: Natalie Lucas   DOB: February 12, 1927   89 y.o. Female  MRN: [id number] Visit Date: 05/19/2024  Today's healthcare provider: Jon Eva, MD   Chief Complaint  Patient presents with  . Annual Exam    Last completed 04/23/23 Diet - Attempts no salt, well balanced  Exercise - none Feeling - well Sleeping - well Concerns - none   Subjective    VAISHALI BAISE is a 89 y.o. female who presents today for a complete physical exam.    Discussed the use of AI scribe software for clinical note transcription with the patient, who gave verbal consent to proceed.  History of Present Illness            Last depression screening scores    03/10/2024    1:20 PM 02/06/2024    3:25 PM 11/18/2023    2:48 PM  PHQ 2/9 Scores  PHQ - 2 Score 0 0 0  PHQ- 9 Score 0  0   Last fall risk screening    03/10/2024    1:23 PM  Fall Risk   Falls in the past year? 0  Number falls in past yr: 0  Injury with Fall? 0  Risk for fall due to : No Fall Risks  Follow up Falls prevention discussed;Falls evaluation completed    {VISON DENTAL STD PSA (Optional):27386}  {History (Optional):23778}  Medications: Outpatient Medications Prior to Visit  Medication Sig  . apixaban  (ELIQUIS ) 5 MG TABS tablet Take 1 tablet (5 mg total) by mouth 2 (two) times daily.  . calcium -vitamin D  (OSCAL WITH D) 500-5 MG-MCG tablet Take 1 tablet by mouth daily with breakfast.  . gabapentin (NEURONTIN) 100 MG capsule Take 100 mg by mouth 2 (two) times daily.  . hydrochlorothiazide  (MICROZIDE ) 12.5 MG capsule TAKE 1 CAPSULE BY MOUTH ONCE DAILY  . losartan (COZAAR) 25 MG tablet Take 25 mg by mouth daily.  . metoprolol  tartrate (LOPRESSOR ) 25 MG tablet Take 25 mg by mouth daily as needed.  . Multiple Vitamins-Minerals (WOMENS 50+ MULTI VITAMIN/MIN PO) Take by mouth.  . ferrous sulfate  324 MG TBEC Take 324 mg by mouth 3 (three) times daily. (Patient not taking: Reported on 05/19/2024)    No facility-administered medications prior to visit.    Review of Systems {Insert previous labs (optional):23779} {See past labs  Heme  Chem  Endocrine  Serology  Results Review (optional):1}  Objective    BP 116/73 (BP Location: Left Arm, Patient Position: Sitting, Cuff Size: Normal)   Pulse 76   Ht 5' 4 (1.626 m)   Wt 185 lb 11.2 oz (84.2 kg)   BMI 31.88 kg/m  {Insert last BP/Wt (optional):23777}{See vitals history (optional):1}  Physical Exam Vitals reviewed.  Constitutional:      General: She is not in acute distress.    Appearance: Normal appearance. She is well-developed. She is not diaphoretic.  HENT:     Head: Normocephalic and atraumatic.  Eyes:     General: No scleral icterus.    Conjunctiva/sclera: Conjunctivae normal.  Neck:     Thyroid: No thyromegaly.  Cardiovascular:     Rate and Rhythm: Normal rate and regular rhythm.     Heart sounds: Normal heart sounds. No murmur heard. Pulmonary:     Effort: Pulmonary effort is normal. No respiratory distress.     Breath sounds: Normal breath sounds. No wheezing, rhonchi or rales.  Musculoskeletal:     Cervical back: Neck supple.  Right lower leg: No edema.     Left lower leg: No edema.  Lymphadenopathy:     Cervical: No cervical adenopathy.  Skin:    General: Skin is warm and dry.     Findings: No rash.  Neurological:     Mental Status: She is alert and oriented to person, place, and time. Mental status is at baseline.  Psychiatric:        Mood and Affect: Mood normal.        Behavior: Behavior normal.      No results found for any visits on 05/19/24.  Assessment & Plan    Routine Health Maintenance and Physical Exam  Exercise Activities and Dietary recommendations  Goals     . DIET - EAT MORE FRUITS AND VEGETABLES    . DIET - REDUCE PORTION SIZE     Recommend to cut portions in half and to eat 3 small meals a day with two healthy snacks in between.         Immunization History   Administered Date(s) Administered  . Fluad Quad(high Dose 65+) 07/30/2019, 08/02/2020, 07/29/2022  . Influenza Split 09/16/2009, 09/05/2011  . Influenza, High Dose Seasonal PF 07/12/2015, 07/18/2016, 07/26/2017, 08/09/2018  . Influenza, Seasonal, Injecte, Preservative Fre 07/31/2023  . Moderna Sars-Covid-2 Vaccination 08/08/2022, 07/31/2023  . PFIZER Comirnaty(Gray Top)Covid-19 Tri-Sucrose Vaccine 11/04/2019, 12/02/2019, 08/04/2020, 03/15/2021  . PFIZER(Purple Top)SARS-COV-2 Vaccination 11/04/2019, 12/02/2019, 08/04/2020  . Pfizer Covid-19 Vaccine Bivalent Booster 31yrs & up 08/09/2021  . Pfizer(Comirnaty)Fall Seasonal Vaccine 12 years and older 08/09/2021  . Pneumococcal Conjugate-13 07/12/2015  . Pneumococcal Polysaccharide-23 08/30/1998  . Zoster Recombinant(Shingrix) 05/11/2022, 11/02/2022    Health Maintenance  Topic Date Due  . DTaP/Tdap/Td (1 - Tdap) Never done  . COVID-19 Vaccine (12 - 2024-25 season) 09/25/2023  . INFLUENZA VACCINE  05/29/2024  . Medicare Annual Wellness (AWV)  03/10/2025  . Pneumococcal Vaccine: 50+ Years  Completed  . DEXA SCAN  Completed  . Zoster Vaccines- Shingrix  Completed  . Hepatitis B Vaccines  Aged Out  . HPV VACCINES  Aged Out  . Meningococcal B Vaccine  Aged Out    Discussed health benefits of physical activity, and encouraged her to engage in regular exercise appropriate for her age and condition.  Problem List Items Addressed This Visit   None               No follow-ups on file.     Jon Eva, MD  [facility] [phone] (phone) [phone] (fax)  [facility]

## 2024-06-30 ENCOUNTER — Other Ambulatory Visit: Payer: Self-pay | Admitting: Family Medicine

## 2024-08-28 ENCOUNTER — Other Ambulatory Visit: Payer: Self-pay | Admitting: Family Medicine

## 2024-09-03 ENCOUNTER — Emergency Department

## 2024-09-03 ENCOUNTER — Emergency Department
Admission: EM | Admit: 2024-09-03 | Discharge: 2024-09-03 | Disposition: A | Discharge status: Home / Self Care | Attending: Emergency Medicine | Admitting: Emergency Medicine

## 2024-09-03 ENCOUNTER — Other Ambulatory Visit: Payer: Self-pay

## 2024-09-03 DIAGNOSIS — I2699 Other pulmonary embolism without acute cor pulmonale: Secondary | ICD-10-CM

## 2024-09-03 DIAGNOSIS — I4891 Unspecified atrial fibrillation: Secondary | ICD-10-CM | POA: Insufficient documentation

## 2024-09-03 DIAGNOSIS — Z79899 Other long term (current) drug therapy: Secondary | ICD-10-CM | POA: Insufficient documentation

## 2024-09-03 LAB — TROPONIN I (HIGH SENSITIVITY): Troponin I (High Sensitivity): 10 ng/L (ref <18)

## 2024-09-03 LAB — BASIC METABOLIC PANEL WITH GFR
Anion gap: 11 (ref 5–15)
BUN: 18 mg/dL (ref 8–23)
CO2: 25 mmol/L (ref 22–32)
Calcium: 8.5 mg/dL — ABNORMAL LOW (ref 8.9–10.3)
Chloride: 98 mmol/L (ref 98–111)
Creatinine, Ser: 1.06 mg/dL — ABNORMAL HIGH (ref 0.44–1.00)
GFR, Estimated: 48 mL/min — ABNORMAL LOW (ref >60)
Glucose, Bld: 260 mg/dL — ABNORMAL HIGH (ref 70–99)
Potassium: 4.6 mmol/L (ref 3.5–5.1)
Sodium: 134 mmol/L — ABNORMAL LOW (ref 135–145)

## 2024-09-03 LAB — CBC
HCT: 37.8 % (ref 36.0–46.0)
Hemoglobin: 12.6 g/dL (ref 12.0–15.0)
MCH: 31.2 pg (ref 26.0–34.0)
MCHC: 33.3 g/dL (ref 30.0–36.0)
MCV: 93.6 fL (ref 80.0–100.0)
Platelets: 260 K/uL (ref 150–400)
RBC: 4.04 MIL/uL (ref 3.87–5.11)
RDW: 13.7 % (ref 11.5–15.5)
WBC: 8.3 K/uL (ref 4.0–10.5)
nRBC: 0 % (ref 0.0–0.2)

## 2024-09-03 LAB — MAGNESIUM: Magnesium: 2.2 mg/dL (ref 1.7–2.4)

## 2024-09-03 LAB — BRAIN NATRIURETIC PEPTIDE: B Natriuretic Peptide: 94.3 pg/mL (ref 0.0–100.0)

## 2024-09-03 MED ORDER — IOHEXOL 350 MG/ML SOLN
75.0000 mL | Freq: Once | INTRAVENOUS | Status: AC | PRN
Start: 2024-09-03 — End: 2024-09-03
  Administered 2024-09-03: 75 mL via INTRAVENOUS

## 2024-09-03 MED ORDER — METOPROLOL TARTRATE 25 MG PO TABS
25.0000 mg | ORAL_TABLET | Freq: Once | ORAL | Status: AC
  Administered 2024-09-03: 25 mg via ORAL
  Filled 2024-09-03: qty 1

## 2024-09-03 MED ORDER — METOPROLOL TARTRATE 5 MG/5ML IV SOLN
5.0000 mg | Freq: Once | INTRAVENOUS | Status: AC
  Administered 2024-09-03: 5 mg via INTRAVENOUS
  Filled 2024-09-03: qty 5

## 2024-09-03 NOTE — Discharge Instructions (Signed)
 Eliquis  twice daily every day  Metoprolol  tartrate 25 mg twice daily every day, take it with the Eliquis 

## 2024-09-03 NOTE — ED Provider Notes (Signed)
-----------------------------------------   4:24 PM on 09/03/2024 -----------------------------------------  I took over care of this patient from Dr. Claudene.  On reassessment, her heart rate is in the 80s to 90s and is in sinus rhythm.  She is asymptomatic at this time and is stable for discharge home.  Her family ember pointed out that she was in fact prescribed from metoprolol  to tartrate once daily, although we recommend that she increase to twice daily based on the recurrent atrial fibrillation.  She will follow-up with her primary care provider.  I gave strict return precautions, and she expressed understanding.   Jacolyn Pae, MD 09/03/24 1625

## 2024-09-03 NOTE — ED Notes (Signed)
 Patient transported to CT

## 2024-09-03 NOTE — ED Triage Notes (Signed)
 Pt comes via [facility] from [facility]. Pt states hx of afib. Pt states today she felt her flutter. Pt was given 10mg  dilt by EMS. Pt did take her metoprolol  this morning. Pt denies any cp.  HR 150 prior to meds  Pt is A*OX4. Pt is on thinner.

## 2024-09-03 NOTE — ED Notes (Signed)
 Lab notified of add-ons to blood work.

## 2024-09-03 NOTE — ED Provider Notes (Signed)
 [facility] Provider Note    Event Date/Time   First MD Initiated Contact with Patient 09/03/24 1051     (approximate)   History   Atrial Fibrillation   HPI  Natalie Lucas is a 89 y.o. female who presents to the ED for evaluation of Atrial Fibrillation   I review a [facility] cardiology telemedicine visit from 1 month ago.  History of paroxysmal A-fib/flutter.  PE undergoing thrombectomy 1 year ago.  Her anticoagulation had been held due to GI bleeding but was discharged on Eliquis  after this PE.  Patient presents to the ED for evaluation of palpitations and an unusual fluttering in her chest that she has felt intermittently over the past 1 day.  Reportedly in rapid A-fib with EMS and they provide 10 mg of IV diltiazem.  By the time I see her she reports feeling better.   Reports inconsistently taking her Eliquis  at home, missing multiple doses per week most weeks.  Reports metoprolol  tartrate 25 mg once daily only   Physical Exam   Triage Vital Signs: ED Triage Vitals  Encounter Vitals Group     BP 09/03/24 1037 130/77     Girls Systolic BP Percentile --      Girls Diastolic BP Percentile --      Boys Systolic BP Percentile --      Boys Diastolic BP Percentile --      Pulse Rate 09/03/24 1037 (!) 110     Resp 09/03/24 1037 18     Temp 09/03/24 1037 98.4 F (36.9 C)     Temp src --      SpO2 09/03/24 1037 94 %     Weight --      Height --      Head Circumference --      Peak Flow --      Pain Score 09/03/24 1036 3     Pain Loc --      Pain Education --      Exclude from Growth Chart --     Most recent vital signs: Vitals:   09/03/24 1454 09/03/24 1526  BP:    Pulse: (!) 125   Resp:    Temp:  (!) 97.5 F (36.4 C)  SpO2:      General: Awake, no distress.  CV:  Good peripheral perfusion.  Resp:  Normal effort.  Abd:  No distention.  MSK:  No deformity noted.  Neuro:  No focal deficits appreciated. Other:     ED Results /  Procedures / Treatments   Labs (all labs ordered are listed, but only abnormal results are displayed) Labs Reviewed  BASIC METABOLIC PANEL WITH GFR - Abnormal; Notable for the following components:      Result Value   Sodium 134 (*)    Glucose, Bld 260 (*)    Creatinine, Ser 1.06 (*)    Calcium  8.5 (*)    GFR, Estimated 48 (*)    All other components within normal limits  CBC  MAGNESIUM  BRAIN NATRIURETIC PEPTIDE  TROPONIN I (HIGH SENSITIVITY)    EKG Atrial flutter with a rate of 94 bpm, 1 PVC.  No STEMI  RADIOLOGY CXR interpreted by me without evidence of acute cardiopulmonary pathology. CTA chest interpreted by me with small PE without signs of right heart strain  Official radiology report(s): CT Angio Chest PE W and/or Wo Contrast Result Date: 09/03/2024 EXAM: CTA of the Chest with contrast for PE 09/03/2024 02:17:32 PM TECHNIQUE:  CTA of the chest was performed after the administration of 75 mL of iohexol (OMNIPAQUE) 350 MG/ML injection. Multiplanar reformatted images are provided for review. MIP images are provided for review. Automated exposure control, iterative reconstruction, and/or weight based adjustment of the mA/kV was utilized to reduce the radiation dose to as low as reasonably achievable. COMPARISON: None available. CLINICAL HISTORY: eval PE. Hx PE requiring thrombectomy at [facility] 1 year ago. Acute SOB, chest pain, DOE FINDINGS: PULMONARY ARTERIES: Pulmonary arteries are adequately opacified for evaluation. Small filling defects are noted in several lower lobe branches of the right pulmonary artery consistent with pulmonary emboli. Main pulmonary artery is normal in caliber. MEDIASTINUM: The heart and pericardium demonstrate no acute abnormality. Coronary artery calcifications are noted. Aortic atherosclerosis. LYMPH NODES: No mediastinal, hilar or axillary lymphadenopathy. LUNGS AND PLEURA: Minimal left basilar atelectasis. Scarring is noted. Cluster of small nodules is  noted laterally in the right upper lobe, likely representing an atypical infection or sequelae from prior such infection. No focal consolidation or pulmonary edema. No pleural effusion or pneumothorax. UPPER ABDOMEN: Moderate-sized hiatal hernia. SOFT TISSUES AND BONES: No acute bone or soft tissue abnormality. IMPRESSION: 1. Small Pulmonary emboli in multiple right lower lobe segmental/subsegmental branches. This finding was discussed with Dr. Claudene at 02:32 pm on 09/03/2024 . 2. Aortic atherosclerosis. 3. Coronary artery calcifications. 4. Moderate hiatal hernia. 5. Minimal left basilar atelectasis/scar. 6. Cluster of small right upper lobe nodules likely infectious or postinfectious. Electronically signed by: Lynwood Seip MD 09/03/2024 02:33 PM EST RP Workstation: [id number]   DG Chest Portable 1 View Result Date: 09/03/2024 EXAM: 1 VIEW(S) XRAY OF THE CHEST 09/03/2024 11:54:11 AM COMPARISON: 11/19/2016 CLINICAL HISTORY: afib rvr FINDINGS: LUNGS AND PLEURA: Ill-defined opacity at left lung base. Small left pleural effusion. No pulmonary edema. No pneumothorax. HEART AND MEDIASTINUM: Cardiomegaly. Aortic atherosclerosis. BONES AND SOFT TISSUES: Hiatal hernia. No acute osseous abnormality. IMPRESSION: 1. Small left pleural effusion with ill-defined left basilar opacity, which may reflect atelectasis or pneumonia. 2. Cardiomegaly. 3. Aortic atherosclerosis. Electronically signed by: Waddell Calk MD 09/03/2024 12:57 PM EST RP Workstation: [id number]    PROCEDURES and INTERVENTIONS:  .Critical Care  Performed by: Claudene Rover, MD Authorized by: Claudene Rover, MD   Critical care provider statement:    Critical care time (minutes):  30   Critical care time was exclusive of:  Separately billable procedures and treating other patients   Critical care was necessary to treat or prevent imminent or life-threatening deterioration of the following conditions:  Cardiac failure and circulatory failure   Critical  care was time spent personally by me on the following activities:  Development of treatment plan with patient or surrogate, discussions with consultants, evaluation of patient's response to treatment, examination of patient, ordering and review of laboratory studies, ordering and review of radiographic studies, ordering and performing treatments and interventions, pulse oximetry, re-evaluation of patient's condition and review of old charts .1-3 Lead EKG Interpretation  Performed by: Claudene Rover, MD Authorized by: Claudene Rover, MD     Interpretation: abnormal     ECG rate:  131   ECG rate assessment: tachycardic     Rhythm: atrial fibrillation     Ectopy: none     Conduction: normal     Medications  iohexol (OMNIPAQUE) 350 MG/ML injection 75 mL (75 mLs Intravenous Contrast Given 09/03/24 1402)  metoprolol  tartrate (LOPRESSOR ) tablet 25 mg (25 mg Oral Given 09/03/24 1521)  metoprolol  tartrate (LOPRESSOR ) injection 5 mg (5 mg Intravenous Given 09/03/24 1520)  IMPRESSION / MDM / ASSESSMENT AND PLAN / ED COURSE  I reviewed the triage vital signs and the nursing notes.  Differential diagnosis includes, but is not limited to, ACS, PTX, PNA, muscle strain/spasm, PE, dissection, anxiety, pleural effusion  {Patient presents with symptoms of an acute illness or injury that is potentially life-threatening.  With history of paroxysmal A-fib and VTE presents with A-fib with RVR and acute PE in the setting of inconsistent compliance with her Eliquis  and metoprolol .  Initially not tachycardic but after imaging and getting up to use the restroom she develops rapid A-fib requiring oral and IV metoprolol .  CT with small PE likely due to medication noncompliance.  We discussed this in detail and she acknowledges very of improvement of reliably taking her Eliquis .  She has normal electrolytes, mild hyperglycemia without acidosis.  Normal CBC.  Not grossly volume overloaded no respiratory failure.    After oral and IV metoprolol  for atrial fibrillation, she is still in rapid A-fib she will require medical admission.  If she mains asymptomatic and no longer in rapid A-fib may be suitable for outpatient management with updated recommendations regarding reliably taking her Eliquis  and metoprolol .  Patient and daughter are agreeable with this plan  Clinical Course as of 09/03/24 1550  Thu Sep 03, 2024  1431 Call from rads. Small peripheral PE, no right heart strain [DS]  1508 reassessed [DS]  1514 Reassessed.  Patient now in A-fib with RVR which she was not in during my previous assessments.  We discussed metoprolol , clarify metoprolol  dosing at home.  She has erroneously been taking 25 mg metoprolol  tartrate once daily [DS]    Clinical Course User Index [DS] Claudene Rover, MD     FINAL CLINICAL IMPRESSION(S) / ED DIAGNOSES   Final diagnoses:  None     Rx / DC Orders   ED Discharge Orders     None        Note:  This document was prepared using Dragon voice recognition software and may include unintentional dictation errors.   Claudene Rover, MD 09/03/24 [phone]

## 2024-09-03 NOTE — ED Notes (Signed)
 Fall bundle verified by this tech

## 2024-09-10 ENCOUNTER — Ambulatory Visit (INDEPENDENT_AMBULATORY_CARE_PROVIDER_SITE_OTHER): Admitting: Family Medicine

## 2024-09-10 ENCOUNTER — Encounter: Payer: Self-pay | Admitting: Family Medicine

## 2024-09-10 VITALS — BP 106/52 | HR 87 | Ht 64.0 in | Wt 184.1 lb

## 2024-09-10 DIAGNOSIS — Z8639 Personal history of other endocrine, nutritional and metabolic disease: Secondary | ICD-10-CM

## 2024-09-10 DIAGNOSIS — E559 Vitamin D deficiency, unspecified: Secondary | ICD-10-CM | POA: Diagnosis not present

## 2024-09-10 DIAGNOSIS — I480 Paroxysmal atrial fibrillation: Secondary | ICD-10-CM

## 2024-09-10 DIAGNOSIS — E1169 Type 2 diabetes mellitus with other specified complication: Secondary | ICD-10-CM

## 2024-09-10 DIAGNOSIS — I2782 Chronic pulmonary embolism: Secondary | ICD-10-CM

## 2024-09-10 DIAGNOSIS — R5382 Chronic fatigue, unspecified: Secondary | ICD-10-CM

## 2024-09-10 NOTE — Assessment & Plan Note (Signed)
 With HTN and HLD Recheck A1c

## 2024-09-10 NOTE — Assessment & Plan Note (Signed)
 Episodes of flutter following exertion. Heart rate managed with metoprolol , currently at 87 bpm. No current rhythm control medication, but rate control is sufficient. Discussed the importance of maintaining heart rate under 100 bpm and the potential need for cardiology follow-up if episodes increase. - Continue metoprolol  tartrate 25 mg BID. - Monitor heart rate, ensuring it remains under 100 bpm. - Consider cardiology follow-up if episodes of AFib increase.

## 2024-09-10 NOTE — Progress Notes (Signed)
 Established patient visit   Patient: Natalie Lucas   DOB: [birth year]   89 y.o. Female  MRN: [id number] Visit Date: 09/10/2024  Today's healthcare provider: Jon Eva, MD   Chief Complaint  Patient presents with   Follow-up    Patient seen at [facility] on 09/03/24. EKG, Labs, DG Chest, CT Angio performed as treatment. Patient found to be in A-fib and was erroneously taking 25 mg metoprolol  tartrate once daily. Patient is established with [facility] with lov being 08/05/24 and pt was advised to take her medication once a day. She reports she had mistakenly missed her eliquis  and gabapentin on several occasions and continued not taking due to her feeling good w/o problems. She reports she has been feeling tired since d/c   Subjective    HPI HPI     Follow-up    Additional comments: Patient seen at [facility] on 09/03/24. EKG, Labs, DG Chest, CT Angio performed as treatment. Patient found to be in A-fib and was erroneously taking 25 mg metoprolol  tartrate once daily. Patient is established with [facility]iology with lov being 08/05/24 and pt was advised to take her medication once a day. She reports she had mistakenly missed her eliquis  and gabapentin on several occasions and continued not taking due to her feeling good w/o problems. She reports she has been feeling tired since d/c      Last edited by Lilian Fitzpatrick, CMA on 09/10/2024 10:08 AM.       Discussed the use of AI scribe software for clinical note transcription with the patient, who gave verbal consent to proceed.  History of Present Illness   Natalie Lucas is a 89 year old female with atrial fibrillation and pulmonary embolism who presents with fatigue and medication management concerns. She is accompanied by her daughter.  She experiences fatigue, particularly after exertion like showering, despite having a handicap accessible shower and a shower chair. She does not require assistance but needs to rest  afterward. She attributes some fatigue to the recent increase in metoprolol  dosage.  She has been compliant with the increased metoprolol  tartrate dosage of 25 mg twice daily, although her last cardiology note indicated once daily dosing. She has experienced several episodes of heart flutter since her last hospital visit, associated with exertion. Her heart rate, previously elevated to 131 bpm, has stabilized to 87 bpm.  She was restarted on Eliquis  for pulmonary embolism but admits to missing doses of Eliquis  and gabapentin, which she takes twice daily. A recent CT scan showed a small pulmonary embolism.  She experiences knee pain, which limits her ability to increase physical activity despite cortisone shots over the past year. The last injection was ineffective. She attempts to walk in her building's hallway but is limited by knee pain. She has not been taking iron due to constipation concerns, but her blood counts are normal.         Medications: Outpatient Medications Prior to Visit  Medication Sig   apixaban  (ELIQUIS ) 5 MG TABS tablet Take 1 tablet (5 mg total) by mouth 2 (two) times daily.   calcium -vitamin D  (OSCAL WITH D) 500-5 MG-MCG tablet Take 1 tablet by mouth daily with breakfast.   ferrous sulfate  324 MG TBEC Take 324 mg by mouth 3 (three) times daily. (Patient not taking: Reported on 05/19/2024)   gabapentin (NEURONTIN) 100 MG capsule Take 100 mg by mouth 2 (two) times daily.   hydrochlorothiazide  (MICROZIDE ) 12.5 MG capsule TAKE 1  CAPSULE BY MOUTH ONCE DAILY   losartan (COZAAR) 25 MG tablet Take 25 mg by mouth daily.   metoprolol  tartrate (LOPRESSOR ) 25 MG tablet Take 25 mg by mouth daily as needed.   Multiple Vitamins-Minerals (WOMENS 50+ MULTI VITAMIN/MIN PO) Take by mouth.   No facility-administered medications prior to visit.    Review of Systems     Objective    BP (!) 106/52 (BP Location: Left Arm, Patient Position: Sitting, Cuff Size: Large)   Pulse 87   Ht 5'  4 (1.626 m)   Wt 184 lb 1.6 oz (83.5 kg)   SpO2 99%   BMI 31.60 kg/m    Physical Exam Vitals reviewed.  Constitutional:      General: She is not in acute distress.    Appearance: Normal appearance. She is well-developed. She is not diaphoretic.  HENT:     Head: Normocephalic and atraumatic.  Eyes:     General: No scleral icterus.    Conjunctiva/sclera: Conjunctivae normal.  Neck:     Thyroid: No thyromegaly.  Cardiovascular:     Rate and Rhythm: Normal rate and regular rhythm.     Heart sounds: Normal heart sounds.  Pulmonary:     Effort: Pulmonary effort is normal. No respiratory distress.     Breath sounds: Normal breath sounds. No wheezing, rhonchi or rales.  Musculoskeletal:     Cervical back: Neck supple.     Right lower leg: No edema.     Left lower leg: No edema.  Lymphadenopathy:     Cervical: No cervical adenopathy.  Skin:    General: Skin is warm and dry.     Findings: No rash.  Neurological:     Mental Status: She is alert and oriented to person, place, and time. Mental status is at baseline.  Psychiatric:        Mood and Affect: Mood normal.        Behavior: Behavior normal.      No results found for any visits on 09/10/24.  Assessment & Plan     Problem List Items Addressed This Visit       Cardiovascular and Mediastinum   Paroxysmal atrial fibrillation (HCC)   Episodes of flutter following exertion. Heart rate managed with metoprolol , currently at 87 bpm. No current rhythm control medication, but rate control is sufficient. Discussed the importance of maintaining heart rate under 100 bpm and the potential need for cardiology follow-up if episodes increase. - Continue metoprolol  tartrate 25 mg BID. - Monitor heart rate, ensuring it remains under 100 bpm. - Consider cardiology follow-up if episodes of AFib increase.      Relevant Orders   TSH   VITAMIN D  25 Hydroxy (Vit-D Deficiency, Fractures)   B12   Basic Metabolic Panel (BMET)      Endocrine   T2DM (type 2 diabetes mellitus) (HCC)   With HTN and HLD Recheck A1c      Relevant Orders   TSH   VITAMIN D  25 Hydroxy (Vit-D Deficiency, Fractures)   B12   Hemoglobin A1c   Basic Metabolic Panel (BMET)     Other   Avitaminosis D   Relevant Orders   TSH   VITAMIN D  25 Hydroxy (Vit-D Deficiency, Fractures)   B12   Other Visit Diagnoses       History of iron deficiency    -  Primary   Relevant Orders   Iron, TIBC and Ferritin Panel     Chronic pulmonary embolism without acute cor  pulmonale, unspecified pulmonary embolism type (HCC)       Relevant Orders   TSH   VITAMIN D  25 Hydroxy (Vit-D Deficiency, Fractures)   B12     Chronic fatigue       Relevant Orders   TSH   VITAMIN D  25 Hydroxy (Vit-D Deficiency, Fractures)   B12           Right-sided chronic pulmonary embolism Small pulmonary embolism in the right lung, likely due to non-compliance with Eliquis . Radiologist suggests it is new, but unclear if it is the same as previous embolism. No intervention required as it is small and unlikely to cause significant issues. - Ensure compliance with Eliquis  twice daily to prevent further embolism.  Chronic fatigue Likely multifactorial, including age, deconditioning, and medication side effects. Blood counts normal, ruling out anemia. Potential vitamin deficiencies or thyroid issues considered. Discussed the possibility of deconditioning due to reduced activity and knee pain. - Ordered labs to check thyroid, B12, vitamin D , and iron levels. - Encouraged light exercise, such as walking in the hallway, to improve endurance.  Vitamin D  deficiency Vitamin D  deficiency. - Ordered vitamin D  level as part of lab work.  General Health Maintenance Received flu shot. - Continue routine health maintenance.        Return for as scheduled.       Jon Eva, MD  [facility] [phone] (phone) [phone] (fax)  [facility]

## 2024-09-11 LAB — BASIC METABOLIC PANEL WITH GFR
BUN/Creatinine Ratio: 18 (ref 12–28)
BUN: 17 mg/dL (ref 10–36)
CO2: 23 mmol/L (ref 20–29)
Calcium: 9.4 mg/dL (ref 8.7–10.3)
Chloride: 104 mmol/L (ref 96–106)
Creatinine, Ser: 0.93 mg/dL (ref 0.57–1.00)
Glucose: 221 mg/dL — ABNORMAL HIGH (ref 70–99)
Potassium: 4.6 mmol/L (ref 3.5–5.2)
Sodium: 143 mmol/L (ref 134–144)
eGFR: 57 mL/min/1.73 — ABNORMAL LOW (ref >59)

## 2024-09-11 LAB — IRON,TIBC AND FERRITIN PANEL
Ferritin: 36 ng/mL (ref 15–150)
Iron Saturation: 27 % (ref 15–55)
Iron: 71 ug/dL (ref 27–139)
Total Iron Binding Capacity: 266 ug/dL (ref 250–450)
UIBC: 195 ug/dL (ref 118–369)

## 2024-09-11 LAB — TSH: TSH: 3.17 u[IU]/mL (ref 0.450–4.500)

## 2024-09-11 LAB — VITAMIN D 25 HYDROXY (VIT D DEFICIENCY, FRACTURES): Vit D, 25-Hydroxy: 34 ng/mL (ref 30.0–100.0)

## 2024-09-11 LAB — HEMOGLOBIN A1C
Est. average glucose Bld gHb Est-mCnc: 157 mg/dL
Hgb A1c MFr Bld: 7.1 % — ABNORMAL HIGH (ref 4.8–5.6)

## 2024-09-11 LAB — VITAMIN B12: Vitamin B-12: 444 pg/mL (ref 232–1245)

## 2024-09-14 ENCOUNTER — Ambulatory Visit: Payer: Self-pay | Admitting: Family Medicine

## 2024-09-28 ENCOUNTER — Telehealth: Payer: Self-pay

## 2024-09-28 NOTE — Telephone Encounter (Signed)
 Copied from CRM #[id number]. Topic: Clinical - Medication Question >> Sep 28, 2024 12:40 PM Selinda RAMAN wrote: Reason for CRM: Natalie Lucas  the daughter of the patient called stating her mother is constantly sleepy now that her metoprolol  tartrate (LOPRESSOR ) 25 MG tablet has been moved to twice a day. Natalie Lucas  is wanting to know if this can be changed due to the effects it is having on her. Or if they need to contact Dr Ammon her Cardiologist they can do that. Whatever her provider thinks is best, Natalie Lucas  just wants what is best for her mother and whatever quality of live they can give her with whatever time she has left. Please assist further and contact Natalie Lucas  at [phone] when her provider is able to respond to this. She is very adult nurse.

## 2024-09-29 NOTE — Telephone Encounter (Signed)
 I understand and am sorry that she is feeling that way. Since Cardiology has been prescribing this medication, please discuss with them about the change.

## 2024-09-29 NOTE — Telephone Encounter (Signed)
 Spoke with [place]  patient's daughter. Reports that she will reach out to Cardiology. Stated that the ED was the one that increased the Metoprolol  and when they follow-up Dr. B said that she was okay managing that. Just an FYI.

## 2024-10-07 ENCOUNTER — Other Ambulatory Visit: Payer: Self-pay

## 2024-10-07 DIAGNOSIS — I480 Paroxysmal atrial fibrillation: Secondary | ICD-10-CM

## 2024-10-07 DIAGNOSIS — I10 Essential (primary) hypertension: Secondary | ICD-10-CM

## 2024-10-07 MED ORDER — METOPROLOL TARTRATE 25 MG PO TABS: 25.0000 mg | ORAL_TABLET | Freq: Two times a day (BID) | ORAL | 3 refills | Status: DC

## 2024-10-07 NOTE — Telephone Encounter (Signed)
 Medication changed and refill sent to pharmacy.

## 2024-10-07 NOTE — Telephone Encounter (Signed)
 Copied from CRM #[id number]. Topic: Clinical - Medication Question >> Oct 07, 2024 10:04 AM Franky GRADE wrote: Reason for CRM: Patient is calling because [facility] a [facility] on metoprolol  tartrate (LOPRESSOR ) 25 MG tablet [[id number]]; however, they informed patient they refilled a 90 day prescription in October and she was no eligible for another refill just yet. She is not sure which provider changed the dosage but she went from taking one a day to taking two a day which caused her to run out faster than normally. She is out of the medication and would like a new prescription to be sent with updated instructions.

## 2024-10-07 NOTE — Telephone Encounter (Signed)
 Noted

## 2024-11-19 ENCOUNTER — Encounter: Payer: Self-pay | Admitting: Family Medicine

## 2024-11-19 ENCOUNTER — Ambulatory Visit (INDEPENDENT_AMBULATORY_CARE_PROVIDER_SITE_OTHER): Admitting: Family Medicine

## 2024-11-19 VITALS — BP 103/62 | HR 81 | Resp 14 | Ht 64.0 in | Wt 183.0 lb

## 2024-11-19 DIAGNOSIS — M1711 Unilateral primary osteoarthritis, right knee: Secondary | ICD-10-CM | POA: Diagnosis not present

## 2024-11-19 DIAGNOSIS — L97921 Non-pressure chronic ulcer of unspecified part of left lower leg limited to breakdown of skin: Secondary | ICD-10-CM

## 2024-11-19 NOTE — Progress Notes (Signed)
 "     Established patient visit   Patient: Natalie Lucas   DOB: February 14, 1927   89 y.o. Female  MRN: [id number] Visit Date: 11/19/2024  Today's healthcare provider: Jon Eva, MD   Chief Complaint  Patient presents with   Medical Management of Chronic Issues   Wound Check    L leg wound due to a car door hitting it. Cardiology saw it on Monday she recommended Dr B saw it, referral placed for vein/vascular. Persistanlt swelling ankle/leg   Subjective    HPI HPI     Wound Check    Additional comments: L leg wound due to a car door hitting it. Cardiology saw it on Monday she recommended Dr B saw it, referral placed for vein/vascular. Persistanlt swelling ankle/leg      Last edited by Wilfred Hargis RAMAN, CMA on 11/19/2024  1:37 PM.       Discussed the use of AI scribe software for clinical note transcription with the patient, who gave verbal consent to proceed.  History of Present Illness   Natalie Lucas is a 89 year old female who presents with a persistent leg ulcer.  Approximately six weeks ago, she injured her leg when a car door hit it, causing a large blood blister that persisted for weeks, then began to ooze and is now scabbing. She has managed it with bandages only and has not used ointments. She has difficulty reaching down to change the bandage herself.  She has persistent swelling in her ankles and feet and was referred for vascular evaluation. She has not previously had nonhealing sores on her legs.  She has atrial fibrillation and was recently hospitalized for this. She feels run down, tired, and short of breath, but her energy has improved after switching metoprolol  to a once daily extended release. She has not had recent episodes of fast heart rate.  She has severe arthritis in her right knee that limits her ability to exercise.      Medications: Show/hide medication list[1]  Review of Systems     Objective    BP 103/62   Pulse 81   Resp 14   Ht 5' 4  (1.626 m)   Wt 183 lb (83 kg)   SpO2 95%   BMI 31.41 kg/m    Physical Exam Constitutional:      Appearance: Normal appearance.  HENT:     Head: Normocephalic and atraumatic.  Cardiovascular:     Rate and Rhythm: Normal rate and regular rhythm.  Pulmonary:     Effort: Pulmonary effort is normal. No respiratory distress.     Breath sounds: Normal breath sounds.  Musculoskeletal:     Right lower leg: Edema present.     Left lower leg: Edema present.  Skin:    Comments: Half dollar sized superficial ulceration of LLE shin, surrounding granulation tissue, no purulent drainage or surrounding erythema  Neurological:     Mental Status: She is alert. Mental status is at baseline.      No results found for any visits on 11/19/24.  Assessment & Plan     Problem List Items Addressed This Visit       Musculoskeletal and Integument   Arthritis, degenerative   Other Visit Diagnoses       Ulcer of left lower extremity, limited to breakdown of skin [facility])    -  Primary   Relevant Orders   Ambulatory referral to Home Health  Chronic ulcer of lower leg Chronic ulceration on the lower leg following trauma from a car door impact six weeks ago. The ulcer is oozing and has not fully healed, with granulation tissue present. The ulcer is not infected but requires ongoing care to promote healing. - Arranged for home health nurse visits twice a week for wound care. - Provided additional Tinaderm and gauze for dressing changes. - Monitor for signs of infection or delayed healing.  Primary osteoarthritis of right knee Severe osteoarthritis in the right knee, described as bone-on-bone. Previous cortisone injections have been ineffective. She is scheduled for gel injections pending approval. Discussed a novel noninvasive radiation treatment for knee arthritis as an alternative if gel injections are ineffective. The radiation treatment involves low-dose radiation to manage pain and  improve activity levels, with positive outcomes reported in other patients. - Proceed with gel injection treatment for knee osteoarthritis. - Will consider referral for low-dose radiation treatment if gel injections are ineffective.        Return in about 3 months (around 02/17/2025) for chronic disease f/u.       Jon Eva, MD  [facility] [phone] (phone) [phone] (fax)  [facility]      [1]  Outpatient Medications Prior to Visit  Medication Sig   apixaban  (ELIQUIS ) 5 MG TABS tablet Take 1 tablet (5 mg total) by mouth 2 (two) times daily.   calcium -vitamin D  (OSCAL WITH D) 500-5 MG-MCG tablet Take 1 tablet by mouth daily with breakfast.   gabapentin (NEURONTIN) 100 MG capsule Take 100 mg by mouth 2 (two) times daily.   hydrochlorothiazide  (MICROZIDE ) 12.5 MG capsule TAKE 1 CAPSULE BY MOUTH ONCE DAILY   losartan (COZAAR) 25 MG tablet Take 25 mg by mouth daily.   METOPROLOL  SUCCINATE ER PO Take 25 mg by mouth daily.   Multiple Vitamins-Minerals (WOMENS 50+ MULTI VITAMIN/MIN PO) Take by mouth.   [DISCONTINUED] ferrous sulfate  324 MG TBEC Take 324 mg by mouth 3 (three) times daily.   [DISCONTINUED] metoprolol  tartrate (LOPRESSOR ) 25 MG tablet Take 1 tablet (25 mg total) by mouth 2 (two) times daily.   No facility-administered medications prior to visit.   "

## 2025-02-18 ENCOUNTER — Ambulatory Visit: Admitting: Family Medicine

## 2025-03-17 ENCOUNTER — Ambulatory Visit
